# Patient Record
Sex: Female | Born: 1975 | Hispanic: Yes | Marital: Married | Smoking: Former smoker
Health system: Southern US, Community
[De-identification: ages and names within clinical notes are randomized; demographics above are authoritative.]

---

## 2017-04-08 ENCOUNTER — Emergency Department (HOSPITAL_COMMUNITY)
Admission: EM | Admit: 2017-04-08 | Discharge: 2017-04-08 | Disposition: A | Discharge status: Home / Self Care | Payer: Self-pay | Attending: Emergency Medicine | Admitting: Emergency Medicine

## 2017-04-08 ENCOUNTER — Emergency Department (HOSPITAL_COMMUNITY): Payer: Self-pay

## 2017-04-08 ENCOUNTER — Other Ambulatory Visit: Payer: Self-pay

## 2017-04-08 ENCOUNTER — Encounter (HOSPITAL_COMMUNITY): Payer: Self-pay

## 2017-04-08 DIAGNOSIS — O200 Threatened abortion: Secondary | ICD-10-CM

## 2017-04-08 DIAGNOSIS — O99331 Smoking (tobacco) complicating pregnancy, first trimester: Secondary | ICD-10-CM | POA: Insufficient documentation

## 2017-04-08 DIAGNOSIS — N3001 Acute cystitis with hematuria: Secondary | ICD-10-CM

## 2017-04-08 DIAGNOSIS — Z3A00 Weeks of gestation of pregnancy not specified: Secondary | ICD-10-CM | POA: Insufficient documentation

## 2017-04-08 DIAGNOSIS — F17210 Nicotine dependence, cigarettes, uncomplicated: Secondary | ICD-10-CM | POA: Insufficient documentation

## 2017-04-08 DIAGNOSIS — B349 Viral infection, unspecified: Secondary | ICD-10-CM

## 2017-04-08 DIAGNOSIS — O2341 Unspecified infection of urinary tract in pregnancy, first trimester: Secondary | ICD-10-CM | POA: Insufficient documentation

## 2017-04-08 LAB — CBC
HCT: 38 % (ref 36.0–46.0)
Hemoglobin: 12.2 g/dL (ref 12.0–15.0)
MCH: 29.3 pg (ref 26.0–34.0)
MCHC: 32.1 g/dL (ref 30.0–36.0)
MCV: 91.1 fL (ref 78.0–100.0)
PLATELETS: 283 10*3/uL (ref 150–400)
RBC: 4.17 MIL/uL (ref 3.87–5.11)
RDW: 14.2 % (ref 11.5–15.5)
WBC: 15.3 10*3/uL — AB (ref 4.0–10.5)

## 2017-04-08 LAB — COMPREHENSIVE METABOLIC PANEL
ALT: 31 U/L (ref 14–54)
ANION GAP: 9 (ref 5–15)
AST: 28 U/L (ref 15–41)
Albumin: 3.7 g/dL (ref 3.5–5.0)
Alkaline Phosphatase: 50 U/L (ref 38–126)
BUN: 8 mg/dL (ref 6–20)
CHLORIDE: 106 mmol/L (ref 101–111)
CO2: 23 mmol/L (ref 22–32)
CREATININE: 0.6 mg/dL (ref 0.44–1.00)
Calcium: 9.1 mg/dL (ref 8.9–10.3)
Glucose, Bld: 117 mg/dL — ABNORMAL HIGH (ref 65–99)
Potassium: 3.1 mmol/L — ABNORMAL LOW (ref 3.5–5.1)
Sodium: 138 mmol/L (ref 135–145)
Total Bilirubin: 0.4 mg/dL (ref 0.3–1.2)
Total Protein: 7.7 g/dL (ref 6.5–8.1)

## 2017-04-08 LAB — URINALYSIS, ROUTINE W REFLEX MICROSCOPIC
BILIRUBIN URINE: NEGATIVE
GLUCOSE, UA: NEGATIVE mg/dL
Ketones, ur: 5 mg/dL — AB
NITRITE: NEGATIVE
PH: 5 (ref 5.0–8.0)
Protein, ur: NEGATIVE mg/dL
SPECIFIC GRAVITY, URINE: 1.011 (ref 1.005–1.030)

## 2017-04-08 LAB — WET PREP, GENITAL
SPERM: NONE SEEN
TRICH WET PREP: NONE SEEN
YEAST WET PREP: NONE SEEN

## 2017-04-08 LAB — ABO/RH: ABO/RH(D): O POS

## 2017-04-08 LAB — HCG, QUANTITATIVE, PREGNANCY: hCG, Beta Chain, Quant, S: 5019 m[IU]/mL — ABNORMAL HIGH (ref <5)

## 2017-04-08 LAB — I-STAT BETA HCG BLOOD, ED (MC, WL, AP ONLY): I-stat hCG, quantitative: >2000 m[IU]/mL — ABNORMAL HIGH (ref <5)

## 2017-04-08 LAB — LIPASE, BLOOD: LIPASE: 19 U/L (ref 11–51)

## 2017-04-08 MED ORDER — CEPHALEXIN 500 MG PO CAPS: 1000.0000 mg | ORAL_CAPSULE | Freq: Two times a day (BID) | ORAL | 0 refills | Status: DC

## 2017-04-08 MED ORDER — ACETAMINOPHEN 500 MG PO TABS: 1000.0000 mg | ORAL_TABLET | Freq: Four times a day (QID) | ORAL | 0 refills | Status: DC | PRN

## 2017-04-08 MED ORDER — ACETAMINOPHEN 500 MG PO TABS
1000.0000 mg | ORAL_TABLET | Freq: Once | ORAL | Status: AC
  Administered 2017-04-08: 1000 mg via ORAL
  Filled 2017-04-08: qty 2

## 2017-04-08 MED ORDER — DEXTROSE 5 % IV SOLN
1.0000 g | Freq: Once | INTRAVENOUS | Status: AC
  Administered 2017-04-08: 1 g via INTRAVENOUS
  Filled 2017-04-08: qty 10

## 2017-04-08 NOTE — ED Notes (Signed)
EDP at bedside with video interpreter

## 2017-04-08 NOTE — Discharge Instructions (Signed)
1.  You need a recheck of your hCG (pregnancy hormone) level in 2 days. 2.  Return to the emergency department if you are having worsening pain or other serious symptoms. 3.  Take the antibiotic, Keflex, as prescribed.

## 2017-04-08 NOTE — ED Triage Notes (Signed)
Video Interpreter used for translation. Patient c/o bilateral lower abdominal pain and vaginal discharge that is blood-tinged at times since 02/28/17.

## 2017-04-08 NOTE — ED Provider Notes (Signed)
[facility]-EMERGENCY DEPT Provider Note   CSN: [id number] Arrival date & time: 04/08/17  1142     History   Chief Complaint Chief Complaint  Patient presents with  . Vaginal Discharge  . Abdominal Pain    HPI Joann Clay is a 41 y.o. female.  HPI Patient reports she has having lower abdominal pain and low back pain.  She indicates the suprapubic area and to both sides.  She reports that she had a abnormal vaginal bleeding November 18, then more bleeding November 22.  She denies having vomiting.  She denies suspicion of STD.  She denies diarrhea.  She does report she has felt generally achy, had a cough recently, subjective fever and generalized headache. History reviewed. No pertinent past medical history.  There are no active problems to display for this patient.   History reviewed. No pertinent surgical history.  OB History    No data available       Home Medications    Prior to Admission medications   Medication Sig Start Date End Date Taking? Authorizing Provider  acetaminophen (TYLENOL) 500 MG tablet Take 2 tablets (1,000 mg total) by mouth every 6 (six) hours as needed. 04/08/17   Arby BarrettePfeiffer, Britiany Silbernagel, MD  cephALEXin (KEFLEX) 500 MG capsule Take 2 capsules (1,000 mg total) by mouth 2 (two) times daily. 04/08/17   Arby BarrettePfeiffer, Aldan Camey, MD    Family History History reviewed. No pertinent family history.  Social History Social History   Tobacco Use  . Smoking status: Current Some Day Smoker    Types: Cigarettes  . Smokeless tobacco: Never Used  Substance Use Topics  . Alcohol use: Yes    Frequency: Never    Comment: occasionally  . Drug use: No     Allergies   Patient has no known allergies.   Review of Systems Review of Systems 10 Systems reviewed and are negative for acute change except as noted in the HPI.   Physical Exam Updated Vital Signs BP (!) 94/58 (BP Location: Left Arm)   Pulse 73   Temp 98.4 F (36.9 C)  (Oral)   Resp 18   Ht 5\' 2"  (1.575 m)   Wt 76.7 kg (169 lb)   LMP 02/28/2017 Comment: Pt does not speak english   SpO2 100%   BMI 30.91 kg/m   Physical Exam  Constitutional: She is oriented to person, place, and time. She appears well-developed and well-nourished. No distress.  Patient is clinically well in appearance.  She is alert and nontoxic.  No respiratory distress.  Moves about the room and ambulates without difficulty or objective appearance of pain.  HENT:  Head: Normocephalic and atraumatic.  Eyes: Conjunctivae and EOM are normal.  Neck: Neck supple.  Cardiovascular: Normal rate and regular rhythm.  No murmur heard. Pulmonary/Chest: Effort normal and breath sounds normal. No respiratory distress.  Abdominal: Soft. She exhibits no distension. There is tenderness.  Mild diffuse tenderness lower abdomen without guarding.  Genitourinary:  Genitourinary Comments: Normal external female genitalia.  Speculum exam: Pool of thin, red tinged discharge in the vaginal vault.  No active bleeding from the cervix.  Bimanual diffusely tender.  Uterus enlarged.  Musculoskeletal: Normal range of motion. She exhibits no edema, tenderness or deformity.  Neurological: She is alert and oriented to person, place, and time. No cranial nerve deficit. She exhibits normal muscle tone. Coordination normal.  Skin: Skin is warm and dry.  Psychiatric: She has a normal mood and affect.  Nursing note and  vitals reviewed.    ED Treatments / Results  Labs (all labs ordered are listed, but only abnormal results are displayed) Labs Reviewed  WET PREP, GENITAL - Abnormal; Notable for the following components:      Result Value   Clue Cells Wet Prep HPF POC PRESENT (*)    WBC, Wet Prep HPF POC MANY (*)    All other components within normal limits  CBC - Abnormal; Notable for the following components:   WBC 15.3 (*)    All other components within normal limits  URINALYSIS, ROUTINE W REFLEX MICROSCOPIC -  Abnormal; Notable for the following components:   Hgb urine dipstick LARGE (*)    Ketones, ur 5 (*)    Leukocytes, UA SMALL (*)    Bacteria, UA RARE (*)    Squamous Epithelial / LPF 0-5 (*)    All other components within normal limits  COMPREHENSIVE METABOLIC PANEL - Abnormal; Notable for the following components:   Potassium 3.1 (*)    Glucose, Bld 117 (*)    All other components within normal limits  HCG, QUANTITATIVE, PREGNANCY - Abnormal; Notable for the following components:   hCG, Beta Chain, Quant, S 5,019 (*)    All other components within normal limits  I-STAT BETA HCG BLOOD, ED ([facility], [facility], [facility] ONLY) - Abnormal; Notable for the following components:   I-stat hCG, quantitative >2,000.0 (*)    All other components within normal limits  LIPASE, BLOOD  RPR  HIV ANTIBODY (ROUTINE TESTING)  ABO/RH  GC/CHLAMYDIA PROBE AMP ([place]) NOT AT [facility]    EKG  EKG Interpretation None       Radiology Dg Chest 2 View  Result Date: 04/08/2017 CLINICAL DATA:  Dry cough for 1 week, smoker EXAM: CHEST  2 VIEW COMPARISON:  None FINDINGS: Normal heart size, mediastinal contours, and pulmonary vascularity. Minimal peribronchial thickening. No pulmonary infiltrate, pleural effusion, or pneumothorax. Bones unremarkable. IMPRESSION: Minimal bronchitic changes without infiltrate. Electronically Signed   By: Ulyses SouthwardMark  Boles M.D.   On: 04/08/2017 18:55   [place] Ob Comp < 14 Wks  Result Date: 04/08/2017 CLINICAL DATA:  Adnexal pain with discharge EXAM: OBSTETRIC <14 WK US AND TRANSVAGINAL OB US TECHNIQUE: Both transabdominal and transvaginal ultrasound examinations were performed for complete evaluation of the gestation as well as the maternal uterus, adnexal regions, and pelvic cul-de-sac. Transvaginal technique was performed to assess early pregnancy. COMPARISON:  None. FINDINGS: Intrauterine gestational sac: Not visible Yolk sac:  Not visible Embryo:  Not visible Maternal uterus/adnexae: Ovaries are  within normal limits. The left ovary measures 5 x 2.1 x 2.1 cm. The right ovary measures 3.7 x 2 x 2.3 cm. Trace fluid in the cul-de-sac. Small nabothian cysts. IMPRESSION: 1. No intrauterine pregnancy is visualized. Findings are consistent with pregnancy of unknown location, differential of which includes recent failed pregnancy, occult ectopic, any intrauterine pregnancy too early to visualize (less likely given HCG > 5,000). Recommend trending of HCG with follow-up pelvic ultrasound as indicated 2. Trace free fluid in the pelvis Electronically Signed   By: Jasmine PangKim  Fujinaga M.D.   On: 04/08/2017 19:56   [place] Ob Transvaginal  Result Date: 04/08/2017 CLINICAL DATA:  Adnexal pain with discharge EXAM: OBSTETRIC <14 WK US AND TRANSVAGINAL OB US TECHNIQUE: Both transabdominal and transvaginal ultrasound examinations were performed for complete evaluation of the gestation as well as the maternal uterus, adnexal regions, and pelvic cul-de-sac. Transvaginal technique was performed to assess early pregnancy. COMPARISON:  None. FINDINGS: Intrauterine gestational sac:  Not visible Yolk sac:  Not visible Embryo:  Not visible Maternal uterus/adnexae: Ovaries are within normal limits. The left ovary measures 5 x 2.1 x 2.1 cm. The right ovary measures 3.7 x 2 x 2.3 cm. Trace fluid in the cul-de-sac. Small nabothian cysts. IMPRESSION: 1. No intrauterine pregnancy is visualized. Findings are consistent with pregnancy of unknown location, differential of which includes recent failed pregnancy, occult ectopic, any intrauterine pregnancy too early to visualize (less likely given HCG > 5,000). Recommend trending of HCG with follow-up pelvic ultrasound as indicated 2. Trace free fluid in the pelvis Electronically Signed   By: Jasmine Pang M.D.   On: 04/08/2017 19:56    Procedures Procedures (including critical care time)  Medications Ordered in ED Medications  cefTRIAXone (ROCEPHIN) 1 g in dextrose 5 % 50 mL IVPB (not  administered)     Initial Impression / Assessment and Plan / ED Course  I have reviewed the triage vital signs and the nursing notes.  Pertinent labs & imaging results that were available during my care of the patient were reviewed by me and considered in my medical decision making (see chart for details).     Final Clinical Impressions(s) / ED Diagnoses   Final diagnoses:  Threatened abortion in first trimester  Acute cystitis with hematuria  Viral syndrome  Patient presents as outlined above with a chief complaint of lower abdominal and back pain and abnormal vaginal bleeding.  Quantitative hCG is at 5000.  Ultrasound does not show IUP.  Patient has been having bleeding.  At this time, have highest suspicion for spontaneous abortion.  Patient is however counseled on the need for repeat quantitative hCG in 2 days to determine if this is elevating or dropping.  She is educated on possible ectopic pregnancy.  Urinalysis does show 6-30 white cells with a good specimen.  Patient is treated with Rocephin IV and will be continued on Keflex for UTI.  She describes URI symptoms such as cough and headache and myalgia.  Physical exam is normal with clear lungs and no pneumonia on chest x-ray.  At this time findings are most suggestive of viral symptoms.  Clinically the patient is well in appearance without any objective respiratory distress.  Patient will follow up in 2 days for repeat quantitative hCG and advised to return for any other concerning symptoms.  ED Discharge Orders        Ordered    cephALEXin (KEFLEX) 500 MG capsule  2 times daily     04/08/17 2128    acetaminophen (TYLENOL) 500 MG tablet  Every 6 hours PRN     04/08/17 2128       Arby Barrette, MD 04/08/17 2137

## 2017-04-08 NOTE — ED Notes (Signed)
US at bedside

## 2017-04-08 NOTE — ED Notes (Signed)
Pt did not come when called from lobby for vitals to be updated x1.  

## 2017-04-09 LAB — SYPHILIS: RPR W/REFLEX TO RPR TITER AND TREPONEMAL ANTIBODIES, TRADITIONAL SCREENING AND DIAGNOSIS ALGORITHM: RPR Ser Ql: NONREACTIVE

## 2017-04-09 LAB — GC/CHLAMYDIA PROBE AMP (~~LOC~~) NOT AT ARMC
Chlamydia: NEGATIVE
NEISSERIA GONORRHEA: NEGATIVE

## 2017-04-09 LAB — HIV ANTIBODY (ROUTINE TESTING W REFLEX): HIV Screen 4th Generation wRfx: NONREACTIVE

## 2017-04-10 LAB — URINE CULTURE: Culture: <10000 — AB

## 2017-07-05 ENCOUNTER — Emergency Department (HOSPITAL_COMMUNITY)
Admission: EM | Admit: 2017-07-05 | Discharge: 2017-07-05 | Disposition: A | Discharge status: Home / Self Care | Payer: Self-pay | Attending: Emergency Medicine | Admitting: Emergency Medicine

## 2017-07-05 ENCOUNTER — Encounter (HOSPITAL_COMMUNITY): Payer: Self-pay | Admitting: Emergency Medicine

## 2017-07-05 DIAGNOSIS — W25XXXA Contact with sharp glass, initial encounter: Secondary | ICD-10-CM | POA: Insufficient documentation

## 2017-07-05 DIAGNOSIS — F17210 Nicotine dependence, cigarettes, uncomplicated: Secondary | ICD-10-CM | POA: Insufficient documentation

## 2017-07-05 DIAGNOSIS — S71111A Laceration without foreign body, right thigh, initial encounter: Secondary | ICD-10-CM | POA: Insufficient documentation

## 2017-07-05 DIAGNOSIS — Y929 Unspecified place or not applicable: Secondary | ICD-10-CM | POA: Insufficient documentation

## 2017-07-05 DIAGNOSIS — S81811A Laceration without foreign body, right lower leg, initial encounter: Secondary | ICD-10-CM

## 2017-07-05 DIAGNOSIS — Y939 Activity, unspecified: Secondary | ICD-10-CM | POA: Insufficient documentation

## 2017-07-05 DIAGNOSIS — Z23 Encounter for immunization: Secondary | ICD-10-CM | POA: Insufficient documentation

## 2017-07-05 DIAGNOSIS — Y999 Unspecified external cause status: Secondary | ICD-10-CM | POA: Insufficient documentation

## 2017-07-05 MED ORDER — CEPHALEXIN 500 MG PO CAPS: 500.0000 mg | ORAL_CAPSULE | Freq: Three times a day (TID) | ORAL | 0 refills | Status: DC

## 2017-07-05 MED ORDER — TETANUS-DIPHTH-ACELL PERTUSSIS 5-2.5-18.5 LF-MCG/0.5 IM SUSP
0.5000 mL | Freq: Once | INTRAMUSCULAR | Status: AC
  Administered 2017-07-05: 0.5 mL via INTRAMUSCULAR
  Filled 2017-07-05: qty 0.5

## 2017-07-05 MED ORDER — LIDOCAINE-EPINEPHRINE (PF) 2 %-1:200000 IJ SOLN
20.0000 mL | Freq: Once | INTRAMUSCULAR | Status: AC
  Administered 2017-07-05: 20 mL
  Filled 2017-07-05: qty 20

## 2017-07-05 NOTE — Discharge Instructions (Signed)
It was my pleasure taking care of you today!  ° °Keep wound clean with mild soap and water. Keep area covered with a topical antibiotic ointment and bandage, keep bandage dry, and do not submerge in water for 24 hours. Ice and elevate for additional pain relief and swelling. Alternate between ibuprofen and Tylenol for additional pain relief. Follow up with your primary care doctor, [facility] or ER in approximately 10 days for wound recheck and suture removal. Monitor area for signs of infection to include, but not limited to: increasing pain, spreading redness, drainage/pus, worsening swelling, or fevers. Return to emergency department for emergent changing or worsening symptoms. ° ° °WOUND CARE °Keep area clean and dry for 24 hours. Do not remove bandage, if applied. °After 24 hours,you should change it at least once a day. Also, change the dressing if it becomes wet or dirty, or as directed by your caregiver.  °Wash the wound with soap and water 2 times a day. Rinse the wound off with water to remove all soap. Pat the wound dry with a clean towel.  °You may shower as usual after the first 24 hours. Do not soak the wound in water until the sutures are removed.  °Return if you experience any of the following signs of infection: Swelling, redness, pus drainage, streaking, fever >101.0 F °Return if you experience excessive bleeding that does not stop after 15-20 minutes of constant, firm pressure. ° ° °

## 2017-07-05 NOTE — ED Provider Notes (Signed)
[facility]-EMERGENCY DEPT Provider Note   CSN: [id number] Arrival date & time: 07/05/17  1205     History   Chief Complaint Chief Complaint  Patient presents with  . Puncture Wound    HPI Margarita MailGlenny Rodriguez-Prado is a 42 y.o. female.  The history is provided by the patient and medical records. A language interpreter was used ([employer] video spanish interpreter).   Margarita MailGlenny Rodriguez-Prado is a 42 y.o. female with no known PMH who presents to the Emergency Department complaining of laceration to right leg which occurred last night.  Patient states that she was sitting on a glass table when it broke and a piece of the glass cut her leg.  Pain with palpation.  No medications taken prior to arrival for symptoms.  No numbness, tingling or weakness.  Unsure of last tetanus vaccine.   History reviewed. No pertinent past medical history.  There are no active problems to display for this patient.   History reviewed. No pertinent surgical history.   OB History   None      Home Medications    Prior to Admission medications   Medication Sig Start Date End Date Taking? Authorizing Provider  acetaminophen (TYLENOL) 500 MG tablet Take 2 tablets (1,000 mg total) by mouth every 6 (six) hours as needed. 04/08/17   Arby BarrettePfeiffer, Marcy, MD  cephALEXin (KEFLEX) 500 MG capsule Take 1 capsule (500 mg total) by mouth 3 (three) times daily. 07/05/17   Ward, Chase PicketJaime Pilcher, PA-C    Family History No family history on file.  Social History Social History   Tobacco Use  . Smoking status: Current Some Day Smoker    Types: Cigarettes  . Smokeless tobacco: Never Used  Substance Use Topics  . Alcohol use: Yes    Frequency: Never    Comment: occasionally  . Drug use: No     Allergies   Patient has no known allergies.   Review of Systems Review of Systems  Constitutional: Negative for chills and fever.  Musculoskeletal: Positive for myalgias.  Skin: Positive for wound.    Neurological: Negative for weakness and numbness.     Physical Exam Updated Vital Signs BP 120/77 (BP Location: Right Arm)   Pulse 81   Temp 98.5 F (36.9 C)   Resp 16   LMP 05/24/2017   SpO2 95%   Physical Exam  Constitutional: She appears well-developed and well-nourished. No distress.  HENT:  Head: Normocephalic and atraumatic.  Neck: Neck supple.  Cardiovascular: Normal rate, regular rhythm and normal heart sounds.  No murmur heard. Pulmonary/Chest: Effort normal and breath sounds normal. No respiratory distress. She has no wheezes. She has no rales.  Musculoskeletal: Normal range of motion.  Neurological: She is alert.  Bilateral lower extremities neurovascularly intact.  Skin: Skin is warm and dry.  6 cm laceration to posterior upper right leg. No surrounding erythema.  Nursing note and vitals reviewed.    ED Treatments / Results  Labs (all labs ordered are listed, but only abnormal results are displayed) Labs Reviewed - No data to display  EKG None  Radiology No results found.  Procedures .Marland Kitchen.Laceration Repair Date/Time: 07/05/2017 2:47 PM Performed by: Ward, Chase PicketJaime Pilcher, PA-C Authorized by: Ward, Chase PicketJaime Pilcher, PA-C   Consent:    Consent obtained:  Verbal   Consent given by:  Patient   Risks discussed:  Pain, infection, poor cosmetic result and poor wound healing Anesthesia (see MAR for exact dosages):    Anesthesia method:  Local infiltration  Local anesthetic:  Lidocaine 2% WITH epi Laceration details:    Location:  Leg   Leg location:  R upper leg   Length (cm):  6 Repair type:    Repair type:  Simple Pre-procedure details:    Preparation:  Patient was prepped and draped in usual sterile fashion Exploration:    Hemostasis achieved with:  Direct pressure   Wound exploration: wound explored through full range of motion and entire depth of wound probed and visualized     Wound extent: no foreign bodies/material noted, no nerve damage noted,  no tendon damage noted and no underlying fracture noted   Treatment:    Area cleansed with:  Saline and Betadine   Amount of cleaning:  Extensive   Irrigation solution:  Sterile saline   Irrigation volume:  1 liter   Irrigation method:  Syringe Mucous membrane repair:    Wound mucous membrane closure material used: Prolene. Skin repair:    Repair method:  Sutures   Suture size:  4-0   Suture material:  Prolene   Suture technique:  Simple interrupted   Number of sutures:  7 Approximation:    Approximation:  Close Post-procedure details:    Dressing:  Open (no dressing)   Patient tolerance of procedure:  Tolerated well, no immediate complications   (including critical care time)  Medications Ordered in ED Medications  Tdap (BOOSTRIX) injection 0.5 mL (has no administration in time range)  lidocaine-EPINEPHrine (XYLOCAINE W/EPI) 2 %-1:200000 (PF) injection 20 mL (has no administration in time range)     Initial Impression / Assessment and Plan / ED Course  I have reviewed the triage vital signs and the nursing notes.  Pertinent labs & imaging results that were available during my care of the patient were reviewed by me and considered in my medical decision making (see chart for details).     Alira Fretwell is a 42 y.o. female who presents to ED for laceration of right thigh.  Injury did occur last night, approx 14 hours ago. Discussed delayed wound closure with attending, Dr. Criss Alvine, who recommends extensive irrigation and closing the wounds as well as placing on prophylactic ABX. Wound thoroughly cleaned in ED today. Wound explored and bottom of wound seen in a bloodless field. No foreign body.  Laceration repaired as dictated above. Patient counseled on home wound care. Follow up with PCP/urgent care or return to ER for suture removal in 10 days. Patient was urged to return to the Emergency Department for worsening pain, swelling, expanding erythema especially if it  streaks away from the affected area, fever, or for any additional concerns. Patient verbalized understanding. All questions answered.   Final Clinical Impressions(s) / ED Diagnoses   Final diagnoses:  Laceration of right lower extremity, initial encounter    ED Discharge Orders        Ordered    cephALEXin (KEFLEX) 500 MG capsule  3 times daily     07/05/17 1444       Ward, Chase Picket, PA-C 07/05/17 1450    Pricilla Loveless, MD 07/06/17 [id number]

## 2017-07-05 NOTE — ED Triage Notes (Signed)
Per interpreter, patient sat on a glass table last night and it broke. Puncture wound approximately two inches to right posterior thigh. Bleeding controlled. Patient ambulatory. Last tdap unknown.

## 2017-08-11 ENCOUNTER — Encounter (HOSPITAL_COMMUNITY): Payer: Self-pay

## 2017-08-11 ENCOUNTER — Other Ambulatory Visit (HOSPITAL_COMMUNITY): Payer: Self-pay | Admitting: *Deleted

## 2017-08-11 DIAGNOSIS — N6310 Unspecified lump in the right breast, unspecified quadrant: Secondary | ICD-10-CM

## 2017-08-31 ENCOUNTER — Encounter (HOSPITAL_COMMUNITY): Payer: Self-pay

## 2017-08-31 ENCOUNTER — Ambulatory Visit
Admission: RE | Admit: 2017-08-31 | Discharge: 2017-08-31 | Disposition: A | Discharge status: Home / Self Care | Payer: No Typology Code available for payment source | Source: Ambulatory Visit | Attending: Obstetrics and Gynecology | Admitting: Obstetrics and Gynecology

## 2017-08-31 ENCOUNTER — Other Ambulatory Visit (HOSPITAL_COMMUNITY): Payer: Self-pay | Admitting: Obstetrics and Gynecology

## 2017-08-31 ENCOUNTER — Ambulatory Visit (HOSPITAL_COMMUNITY)
Admission: RE | Admit: 2017-08-31 | Discharge: 2017-08-31 | Disposition: A | Discharge status: Home / Self Care | Payer: Self-pay | Source: Ambulatory Visit | Attending: Obstetrics and Gynecology | Admitting: Obstetrics and Gynecology

## 2017-08-31 VITALS — BP 110/68 | Ht 62.0 in | Wt 169.0 lb

## 2017-08-31 DIAGNOSIS — N6310 Unspecified lump in the right breast, unspecified quadrant: Secondary | ICD-10-CM

## 2017-08-31 DIAGNOSIS — R87612 Low grade squamous intraepithelial lesion on cytologic smear of cervix (LGSIL): Secondary | ICD-10-CM

## 2017-08-31 DIAGNOSIS — Z1239 Encounter for other screening for malignant neoplasm of breast: Secondary | ICD-10-CM

## 2017-08-31 DIAGNOSIS — N644 Mastodynia: Secondary | ICD-10-CM

## 2017-08-31 NOTE — Addendum Note (Signed)
Encounter addended by: Amahd Morino P, RN on: 08/31/2017 12:58 PM  Actions taken: Sign clinical note

## 2017-08-31 NOTE — Patient Instructions (Addendum)
Explained breast self awareness with Libi Clay. Patient did not need a Pap smear today due to last Pap smear was 07/27/2017. Explained the colposcopy the recommended follow-up for her abnormal Pap smear. [facility] the [facility] for a Colposcopy to follow-up for abnormal Pap smear. Appointment scheduled for Monday, September 20, 2017 at 1455. Referred patient to the [facility] of [facility] for a diagnostic mammogram and possible right breast ultrasound. Appointment scheduled for Tuesday, Aug 31, 2017 at 1120. Patient aware of appointments and will be there. Discussed smoking cessation with patient. Referred to the [facility] Quitline and gave resources to the free smoking cessation classes at [facility]. Joann Clay verbalized understanding.  Brannock, Kathaleen Maser, RN 9:59 AM

## 2017-08-31 NOTE — Progress Notes (Addendum)
Patient referred to BCCCP by the [facility] Department due to having an abnormal Pap smear on 07/27/2017 that a colposcopy is recommended for follow-up.   Patient complained of a right breast lump since December 2018 that is painful. Patient states the pain comes and goes. Patient rates the pain at a 7 out of 10.  Pap Smear: Pap smear not completed today. Last Pap smear was 07/27/2017 at the [facility] and [facility]. Referred patient the [facility] at [facility] for a Colposcopy to follow-up for abnormal Pap smear. Appointment scheduled for Monday, September 20, 2017 at 1455. Per patient her most recent Pap smear is the only abnormal Pap smear she has had. Last Pap smear result is in Epic.  Physical exam: Breasts Breasts symmetrical. No skin abnormalities bilateral breasts. No nipple retraction bilateral breasts. No nipple discharge bilateral breasts. No lymphadenopathy. No lumps palpated left breast. Palpated a bb sized lump within the right breast under nipple. Complaints of tenderness when palpated right breast lump and mild tenderness when palpated left outer breast on exam. Referred patient to the [facility] of [facility] for a diagnostic mammogram and possible right breast ultrasound. Appointment scheduled for Tuesday, Aug 31, 2017 at 1120.        Pelvic/Bimanual No Pap smear completed today since last Pap smear was 07/27/2017. Pap smear not indicated per BCCCP guidelines.   Smoking History: Patient is a current smoker. Discussed smoking cessation with patient. Referred to the [facility] Quitline and gave resources to the free smoking cessation classes at [facility]  Patient Navigation: Patient education provided. Access to services provided for patient through [facility] program. Spanish interpreter provided.   Breast and Cervical Cancer Risk Assessment: Patient has no family history of breast cancer, known genetic mutations, or radiation treatment  to the chest before age 35. Patient has no history of cervical dysplasia, immunocompromised, or DES exposure in-utero. Patient has a 5-year risk of breast cancer at 0.4% and a Lifetime risk of 5.7%.  Used Spanish interpreter [employer] from [place].

## 2017-09-17 ENCOUNTER — Encounter (HOSPITAL_COMMUNITY): Payer: Self-pay | Admitting: *Deleted

## 2017-09-20 ENCOUNTER — Ambulatory Visit (INDEPENDENT_AMBULATORY_CARE_PROVIDER_SITE_OTHER): Payer: Self-pay | Admitting: Obstetrics & Gynecology

## 2017-09-20 ENCOUNTER — Other Ambulatory Visit (HOSPITAL_COMMUNITY)
Admission: RE | Admit: 2017-09-20 | Discharge: 2017-09-20 | Disposition: A | Discharge status: Home / Self Care | Payer: No Typology Code available for payment source | Source: Ambulatory Visit | Attending: Obstetrics & Gynecology | Admitting: Obstetrics & Gynecology

## 2017-09-20 ENCOUNTER — Encounter: Payer: Self-pay | Admitting: Obstetrics & Gynecology

## 2017-09-20 VITALS — BP 122/73 | HR 81 | Wt 170.4 lb

## 2017-09-20 DIAGNOSIS — R87612 Low grade squamous intraepithelial lesion on cytologic smear of cervix (LGSIL): Secondary | ICD-10-CM

## 2017-09-20 LAB — POCT PREGNANCY, URINE: Preg Test, Ur: NEGATIVE

## 2017-09-20 NOTE — Progress Notes (Signed)
Patient given informed consent, signed copy in the chart, time out was performed.  Placed in lithotomy position. Cervix viewed with speculum and colposcope after application of acetic acid.  07/28/2017 [facility]  Colposcopy adequate?  yes Acetowhite lesions? yes Punctation? no Mosaicism?  no Abnormal vasculature?  no Biopsies? Yes at 4-6 o'clock ECC? yes   Patient was given post procedure instructions.  She will return in 2 weeks for results. Interpreter #[id number]  Concepcion Elkarolyn L. Harraway-Smith, M.D., [place] CoreFACOG

## 2017-09-20 NOTE — Progress Notes (Signed)
Video Interpreter # [id number]

## 2017-09-20 NOTE — Patient Instructions (Signed)
Colposcopia (Colposcopy) La colposcopia es un procedimiento para examinar la parte inferior del tero (cuello del tero), la vagina, la zona que rodea la abertura vaginal (vulva) para identificar anormalidades o signos de enfermedad. Para realizar este procedimiento se utiliza un microscopio con luz o una lupa (colposcopio). Si se encuentran clulas inusuales durante el procedimiento, el mdico puede extraer una muestra de tejido para examinarla (biopsia). Se puede realizar una colposcopia si:  Tiene un Papanicolaou anormal. Un Papanicolaou es una prueba de deteccin que se utiliza para detectar signos de cncer o infeccin en la vagina, el cuello del tero y el tero.  Tiene un Papanicolaou con resultado positivo de VPH de alto riesgo (virus del papiloma humano).  Si tiene una llaga o una lesin en el cuello del tero.  Tiene verrugas genitales en la vulva, la vagina o el cuello del tero.  Tom ciertos medicamentos durante el embarazo, como dietiletilbestrol (DES).  Siente dolor durante las relaciones sexuales.  Tiene hemorragias vaginales, especialmente despus de mantener relaciones sexuales.  Tienen que extraerle un plipo del cuello del tero.  Tienen que encontrarle un hilo perdido del dispositivo intrauterino (DIU). INFORME A SU MDICO:  Cualquier alergia que tenga, incluidas las alergias a medicamentos recetados, al ltex o al yodo.  Todos los medicamentos que utiliza, incluidos vitaminas, hierbas, gotas oftlmicas, cremas y medicamentos de venta libre. Lleve una lista de todos sus medicamentos a la consulta.  Cualquier problema previo que usted o los miembros de su familia hayan tenido con anestsicos.  Enfermedades de la sangre que tenga.  Cirugas previas.  Cualquier afeccin mdica que tenga, como enfermedad plvica inflamatoria (EPI) o trastorno endometrial.  Algn antecedente de desmayos frecuentes.  Su ciclo menstrual y el mtodo de control de la natalidad  (anticonceptivo) que utiliza.  Su historia clnica, incluido cualquier tratamiento previo en el cuello del tero.  Si est embarazada o podra estarlo. RIESGOS Y COMPLICACIONES En general, se trata de un procedimiento seguro. Sin embargo, pueden ocurrir complicaciones, por ejemplo:  Dolor.  Infeccin, que puede incluir fiebre, secrecin con mal olor o dolor plvico.  Hemorragia o secrecin.  Diagnstico errneo.  Desmayos y reacciones vasovagales, pero esto es poco frecuente.  Reacciones alrgicas a los medicamentos.  Daos a otras estructuras u otros rganos. ANTES DEL PROCEDIMIENTO  Si tiene el perodo menstrual o lo tendr en el momento del procedimiento, dgaselo al mdico. En general, la colposcopia no se realiza durante la menstruacin.  Contine con sus prcticas anticonceptivas antes y despus del procedimiento.  Durante las 24horas previas a la colposcopia: ? No se haga duchas vaginales. ? No use tampones. ? No se aplique medicamentos, cremas o supositorios en la vagina. ? No tenga relaciones sexuales.  Consulte al mdico si debe hacer o no lo siguiente: ? Cambiar o suspender los medicamentos que toma habitualmente. Esto es muy importante si toma medicamentos para la diabetes o anticoagulantes. ? Tomar medicamentos como aspirina e ibuprofeno. Estos medicamentos pueden tener un efecto anticoagulante en la sangre. No tome estos medicamentos antes del procedimiento si el mdico le indica que no lo haga. Es probable que el mdico le diga que evite tomar aspirina o medicamentos que contienen aspirina durante 7das antes del procedimiento.  Siga las indicaciones del mdico respecto de las restricciones para las comidas o las bebidas. Probablemente deba seguir una dieta normal el da del procedimiento y no omitir ninguna comida.  Pueden hacerle un examen o estudios. Se realizar una prueba de embarazo el da del procedimiento.  Pueden   extraerle una muestra de [place] o  pedirle [place]una muestra de [place] Runorina.  Haga planes para que una persona la lleve a su casa despus del hospital o la clnica.  Si se ir a su casa inmediatamente despus del procedimiento, planifique que alguien se quede con usted durante 24horas. PROCEDIMIENTO  Estar acostada boca arriba, con los pies eLauris Poagn los soportes (estribos).  Se introducir en la vagina un instrumento tibio y lubricado (espculo). El Reliant Energyespculo se usar para separar las paredes de la vagina de modo que el mdico pueda ver el cuello del tero y el interior de la vagina.  Se utilizar un hisopo para Scientific laboratory techniciancolocar una pequea cantidad de solucin lquida en las zonas que se examinarn. Esta solucin facilita la observacin de clulas anormales. Puede sentir un leve ardor en este momento.  Se usar el colposcopio para observar el cuello del tero con una luz blanca brillante. Se sostendr el colposcopio cerca de la vulva, y Electronics engineereste amplificar la vulva, la vagina y el cuello del tero para facilitar la observacin.  El mdico puede decidir si realiza una biopsia. En este caso: ? Pueden administrarle un medicamento para adormecer la zona (anestesia local). ? Se emplearn instrumentos quirrgicos para succionar mucosidad y clulas a travs de la vagina. ? Puede sentir un dolor leve cuando le extraen la [place]muestra de tejido. ? Puede haber hemorragia. Se puede usar una solucin para Comptrollerdetener la hemorragia. ? Si se necesita Lauris Poaguna muestra de tejido del interior del cuello del tero, se puede realizar otro procedimiento, llamado curetaje endocervical (CEC). Durante este procedimiento, se usar un instrumento curvo (cureta) para raspar clulas del cuello del tero o de la parte superior del tero (endocervix).  El Engineer, civil (consulting)mdico registrar la ubicacin de cualquier anormalidad. Este procedimiento puede variar segn el mdico y el hospital. DESPUS DEL PROCEDIMIENTO  Estar recostada y har reposo durante unos minutos. Tal vez le ofrezcan jugo o galletas.  Le  controlarn la presin arterial, la frecuencia cardaca, la frecuencia respiratoria y Air cabin crewel nivel de oxgeno en la sangre hasta que haya desaparecido el efecto de los medicamentos administrados.  Quizs Huntsman Corporationdeba usar medias de compresin. Estas medias ayudan a Transport plannerevitar la formacin de cogulos sanguneos y a Building services engineerreducir la hinchazn de las piernas.  Puede tener calambres en el abdomen. Esto debe desaparecer despus de algunos minutos. Esta informacin no tiene Theme park managercomo fin reemplazar el consejo del mdico. Asegrese de hacerle al mdico cualquier pregunta que tenga. Document Released: 03/30/2005 Document Revised: 11/30/2012 Document Reviewed: 11/04/2015 Elsevier Interactive Patient Education  2018 [employer] Colposcopa - Cuidados posteriores (Colposcopy, Care After) Siga estas instrucciones durante las prximas semanas. Estas indicaciones le proporcionan informacin general acerca de cmo deber cuidarse despus del procedimiento. El mdico tambin podr darle instrucciones ms especficas. El tratamiento se ha planificado de acuerdo a las prcticas mdicas actuales, pero a veces se producen problemas. Comunquese con el mdico si tiene algn problema o tiene dudas despus del procedimiento. QU ESPERAR DESPUS DEL PROCEDIMIENTO  Despus del procedimiento, es tpico tener las siguientes sensaciones:  Clicos. Generalmente se calman en algunos minutos.  Dolor. Puede durar [facility] [place] dos das.  Aturdimiento. Si esto le ocurre, recustese durante algunos minutos. Podr tener un sangrado leve o una secrecin oscura que debe detenerse en [place]gunos das. Durante este tiempo deber usar un apsito sanitario. INSTRUCCIONES PARA EL CUIDADO EN EL HOGAR  Evite las [address] Nerelaciones sexuales, las duchas vaginales y el uso de tampones durante 3 [place]das, o segn lo que le indique su mdico.  Tome slo medicamentos  de venta libre o recetados, segn las indicaciones del mdico. No tome aspirina, ya que puede causar hemorragias.  Si  utiliza pldoras anticonceptivas, contine tomndolas.  No todos los resultados estarn disponibles durante su visita. En este caso, tenga otra entrevista con su mdico para conocerlos. No suponga que es normal si no tiene noticias de su mdico o del establecimiento de salud. Es Copy seguimiento de todos los [place].  Siga los consejos de su mdico con respecto a los [place], [place], visitas y Papanicolau de control. SOLICITE ATENCIN MDICA SI:  Aparece una erupcin cutnea.  Tiene problemas con los medicamentos. SOLICITE ATENCIN MDICA DE INMEDIATO SI:  Tiene una hemorragia abundante o elimina cogulos.  Tiene fiebre.  Tiene flujo vaginal anormal.  Tiene clicos que no se alivian luego de tomar analgsicos.  Se siente mareada, tiene vahdos o se desmaya.  Siente Physiological scientist. Esta informacin no tiene Theme park manager el consejo del mdico. Asegrese de hacerle al mdico cualquier pregunta que tenga. Document Released: 01/18/2013 Elsevier Interactive Patient Education  2017 [employer].

## 2017-10-11 ENCOUNTER — Encounter: Payer: Self-pay | Admitting: Obstetrics & Gynecology

## 2017-10-11 ENCOUNTER — Ambulatory Visit (INDEPENDENT_AMBULATORY_CARE_PROVIDER_SITE_OTHER): Payer: Self-pay | Admitting: Obstetrics & Gynecology

## 2017-10-11 VITALS — BP 120/71 | HR 87 | Resp 16 | Wt 170.4 lb

## 2017-10-11 DIAGNOSIS — Z3009 Encounter for other general counseling and advice on contraception: Secondary | ICD-10-CM

## 2017-10-11 MED ORDER — LEVONORGESTREL-ETHINYL ESTRAD 0.15-30 MG-MCG PO TABS: 1.0000 | ORAL_TABLET | Freq: Every day | ORAL | 11 refills | Status: DC

## 2017-10-11 NOTE — Progress Notes (Signed)
History:  42 y.o. [id number] here today for f/u of colpo results. Pt is also interested in a refill of her birth control pills. She has been on [facility]. She ran out 2 weeks ago and has not been sexually active due to her cervical bx and the fact that she did not have a refill on her contraception. She has no new complaints. She has been on OCPs without complications.    The following portions of the patient's history were reviewed and updated as appropriate: allergies, current medications, past family history, past medical history, past social history, past surgical history and problem list.  Review of Systems:  Pertinent items are noted in HPI.    Objective:  Physical Exam Blood pressure 120/71, pulse 87, resp. rate 16, weight 170 lb 6.4 oz (77.3 kg), last menstrual period 09/15/2017.  CONSTITUTIONAL: Well-developed, well-nourished female in no acute distress.  HENT:  Normocephalic, atraumatic EYES: Conjunctivae and EOM are normal. No scleral icterus.  NECK: Normal range of motion SKIN: Skin is warm and dry. No rash noted. Not diaphoretic.No pallor. NEUROLGIC: Alert and oriented to person, place, and time. Normal coordination.   Labs and Imaging 09/20/2017 1. Cervix, biopsy, 4-6 o'clock - LOW GRADE SQUAMOUS INTRAEPITHELIAL LESION (CIN1, MILD DYSPLASIA) - BENIGN ENDOCERVICAL GLANDULAR MUCOSA 2. Endocervix, curettage - BENIGN ENDOCERVICAL GLANDULAR EPITHELIUM - NO SQUAMOUS EPITHELIUM IDENTIFIED - NO MALIGNANCY IDENTIFIED Assessment & Plan:  Cervical dysplasia-  Reviewed cervical dysplasia and the tx options  Will observe for now  F/u in 1 year  Contraception counseling  Refilled Portia generic 1.5/30 OCPs  F/u in 1 year  Pt encouraged to use condoms for the 1st 2 weeks or restarting the pill  Total face-to-face time with patient was 18 min.  Greater than 50% was spent in counseling and coordination of care with the patient.  A spanish interpreter was used for the entire  exam  Zanaria Morell L. Harraway-Smith, M.D., Evern CoreFACOG

## 2017-10-11 NOTE — Patient Instructions (Signed)
Uso de los Electronics engineer (Oral Contraception Use) Los anticonceptivos orales (ACO) son medicamentos que se utilizan para Therapist, occupational. Su funcin es [employer] ovarios liberen vulos. Las hormonas de los ACO tambin hacen que el moco cervical se haga ms espeso, lo que evita que el esperma ingrese al tero. Tambin hacen que la membrana que recubre internamente al tero se vuelva ms fina, lo que no permite que el huevo fertilizado se adhiera a la pared del tero. Los ACO son muy efectivos cuando se toman exactamente como se prescriben. Sin embargo, no previenen contra las enfermedades de transmisin sexual (ETS). La prctica del sexo seguro, como el uso de preservativos, junto con los ACO, [place] a prevenir ese tipo de enfermedades. Antes de tomar ACO, debe hacerse un examen fsico y un Papanicolau. El mdico podr indicarle anlisis de [place], si es necesario. El mdico se asegurar de que usted sea [place] buena candidata para usar anticonceptivos orales. Converse con su mdico acerca de los posibles efectos secundarios de los ACO que podran recetarle. Cuando se inicia el uso de ACO, se pueden tomar durante 2 a 3 meses para que el cuerpo se adapte a los cambios en los niveles hormonales en el cuerpo. CMO TOMAR LOS ANTICONCEPTIVOS ORALES El mdico le indicar como comenzar a Holiday representative de ACO. De lo contrario usted puede:  Journalist, newspaper de inicio del ciclo menstrual. No necesitar proteccin anticonceptiva adicional al Engineer, manufacturing.  Comenzar Software engineer domingo luego de su perodo menstrual, o Games developer en que adquiere el [employer]. En estos casos deber [employer]na proteccin anticonceptiva [employer] primeros 7 das del [place].  Comenzar a tomarlos en cualquier momento del ciclo. Si toma el anticonceptivo dentro de los 5 das de iniciado el perodo, Personnel officer protegida de quedar embarazada inmediatamente. En este caso, no necesitar una forma adicional de  anticonceptivos. Si comienza en cualquier otro momento del ciclo menstrual, necesitar usar otra forma de anticonceptivo durante 7 das. Si sus ACO son del tipo de los [employer], podrn impedir el embarazo despus de tomarlas por 2 das (48 horas). Luego de comenzar a tomar los ACO:  Si olvid de tomar 1 pldora, tmela tan pronto como lo recuerde. Tome la siguiente pldora a la hora habitual.  Si dej de tomar 2 o ms pldoras, comunquese con su mdico ya que diferentes pldoras tienen diferentes instrucciones para las dosis que no se han tomado. Si olvida tomar 2 o ms pldoras, utilice un mtodo anticonceptivo adicional hasta que comience su prximo perodo menstrual.  Si utiliza el envase de 28 pldoras que contienen pldoras inactivas y [place] tomar 1 de las ltimas 7 (pldoras sin hormonas), sto no tiene Education administrator. Simplemente deseche el resto de las pldoras que no contienen hormonas y comience un nuevo envase. No importa cuando comience a tomar los anticonceptivos, siempre empiece un nuevo envase el mismo da de la [place]. Tenga un envase extra de ACO y use un mtodo anticonceptivo adicional para Education officer, museum en que se olvide de tomar algunas pldoras o pierda la caja. INSTRUCCIONES PARA EL CUIDADO EN EL HOGAR  No fume.  Use siempre un condn para protegerse contra las enfermedades de transmisin sexual. Los ACO no protegen contra las enfermedades de transmisin sexual.  Use un almanaque para [employer] de su perodo menstrual.  Lea la informacin y consejos que vienen con las ACO. Hable con el profesional si tiene dudas.  SOLICITE ATENCIN MDICA SI:  Presenta nuseas  o vmitos.  Tiene flujo o sangrado vaginal anormal.  Aparece una erupcin cutnea.  No tiene el perodo menstrual.  Pierde el cabello.  Necesita tratamiento por cambios en su estado de nimo o por depresin.  Se siente mareada al [employer].  Comienza a aparecer acn con el uso de los  ACO.  Ardelle Anton.  SOLICITE ATENCIN MDICA DE INMEDIATO SI:  Siente dolor en el pecho.  Le falta el aire.  Le duele mucho la cabeza y no puede Human resources officer.  Siente adormecimiento o tiene dificultad para hablar.  Tiene problemas de visin.  Presenta dolor, inflamacin o hinchazn en las piernas.  Esta informacin no tiene Theme park manager el consejo del mdico. Asegrese de hacerle al mdico cualquier pregunta que tenga. Document Released: 03/19/2011 Document Revised: 07/22/2015 Document Reviewed: 09/18/2012 Elsevier Interactive Patient Education  2017 Elsevier Inc. Displasia cervical (Cervical Dysplasia) La displasia cervical es una afeccin que ocurre cuando una mujer presenta cambios anormales en las clulas del cuello del tero. El cuello del tero es la abertura del tero. Se encuentra entre la vagina y Careers information officer. La displasia cervical puede ser el primer signo de cncer de cuello del tero. Casi todos los casos de displasia cervical pueden curarse gracias a la deteccin temprana, el tratamiento y controles rigurosos. Si no se la trata, puede agravarse. CAUSAS Una infeccin causada por el virus del Geneticist, molecular (VPH) puede provocar la displasia cervical. FACTORES DE RIESGO  Haber padecido una enfermedad de transmisin sexual, como clamidia o infeccin por el VPH.  Ser sexualmente activa antes de los 18 aos.  Haber tenido ms de 1 compaero sexual.  No usar proteccin [employer] [place] [employer] sexuales, especialmente con compaeros sexuales nuevos.  Haber sufrido cncer en la vagina o en la vulva.  Haber tenido un compaero sexual cuya pareja anterior padeci cncer de cuello del tero o displasia cervical.  Tener un compaero sexual que tiene o ha tenido cncer de pene.  Presentar un debilitamiento del sistema inmunitario (a causa del VIH o de un trasplante de rgano).  Ser hija de una mujer que tom dietilestilbestrol (DES) durante el  embarazo.  Tener antecedentes familiares de cncer de cuello del tero.  El hbito de fumar.  SIGNOS Y SNTOMAS Generalmente, no se presentan sntomas. Si hay sntomas, estos pueden incluir:  Flujo vaginal anormal.  Sangrado entre perodos menstruales o despus de Sales promotion account executive.  Sangrado durante la menopausia.  Dolor durante las relaciones sexuales (dispareunia). DIAGNSTICO Se puede realizar una prueba de Papanicolaou. Durante esta prueba, se extraen clulas del cuello del tero y luego se analizan con un microscopio. Tambin puede realizarse una prueba en la que se extirpa tejido del cuello del tero (biopsia), si la prueba de Papanicolaou es anormal o si el aspecto del cuello del tero es anormal. TRATAMIENTO El tratamiento vara en funcin de la gravedad de la displasia cervical. El tratamiento puede incluir:  Crioterapia. Durante la crioterapia, las clulas anormales se congelan con un instrumento con punta de acero.  Un procedimiento para extirpar tejido anormal del cuello del tero.  Ciruga para extirpar tejido anormal. Generalmente, se lleva a cabo en casos graves de displasia cervical. Las opciones quirrgicas son: ? Biopsia en cono. Este es un procedimiento en el que se extirpan el canal cervical y [place]omia Dear parte del centro del cuello del tero. ? Histerectoma En esta ciruga se extirpan el tero y el cuello del tero. INSTRUCCIONES PARA EL CUIDADO EN EL HOGAR  Slo tome medicamentos  de venta libre o recetados para Primary school teachercalmar el dolor o Environmental health practitionerel malestar, segn las indicaciones de su mdico.  No utilice tampones, duchas vaginales ni tenga relaciones sexuales hasta que el profesional la autorice.  Cumpla con todas las visitas de control, segn le indique su mdico. Las mujeres que han recibido tratamiento para la displasia cervical deberan someterse a pruebas de Papanicolaou y exmenes plvicos de forma regular. Durante el primer ao despus del tratamiento de la  displasia cervical, la prueba de Papanicolaou se debe realizar cada 3 a 4 meses. En el segundo ao, se debe realizar cada 6 meses o segn lo recomendado por el mdico.  Para evitar la recurrencia de la afeccin, practique el sexo seguro.  SOLICITE ATENCIN MDICA SI: Presenta verrugas genitales. SOLICITE ATENCIN MDICA DE INMEDIATO SI:  El perodo menstrual es ms abundante que lo normal.  Presenta un sangrado rojo brillante, especialmente si tiene cogulos sanguneos.  Tiene fiebre.  Siente clicos o dolor que [place]umenta y no se alivia con medicamentos.  Se siente mareada, inusualmente dbil o se desmaya.  Tiene flujo vaginal anormal.  Siente dolor abdominal.  Esta informacin no tiene como fin reemplazar el consejo del mdico. Asegrese de hacerle al mdico cualquier pregunta que tenga. Document Released: 01/18/2013 Document Revised: 07/22/2015 Document Reviewed: 11/23/2012 Elsevier Interactive Patient Education  2017 [employer]vier [employer].

## 2018-03-09 ENCOUNTER — Other Ambulatory Visit: Payer: No Typology Code available for payment source

## 2018-03-21 ENCOUNTER — Other Ambulatory Visit: Payer: Self-pay

## 2018-03-21 DIAGNOSIS — Z3009 Encounter for other general counseling and advice on contraception: Secondary | ICD-10-CM

## 2018-03-21 MED ORDER — LEVONORGESTREL-ETHINYL ESTRAD 0.15-30 MG-MCG PO TABS: 1.0000 | ORAL_TABLET | Freq: Every day | ORAL | 11 refills | Status: DC

## 2018-03-29 ENCOUNTER — Telehealth (HOSPITAL_COMMUNITY): Payer: Self-pay | Admitting: *Deleted

## 2018-03-29 NOTE — Telephone Encounter (Signed)
Called patient with Spanish interpreter Nira ConnJulia Sowell to remind patient that she needs to reschedule her 6566-month right breast diagnostic mammogram. Patient stated she will schedule appointment and verbalized understanding.

## 2018-12-13 ENCOUNTER — Encounter (HOSPITAL_COMMUNITY): Payer: Self-pay | Admitting: Emergency Medicine

## 2018-12-13 ENCOUNTER — Emergency Department (HOSPITAL_COMMUNITY)
Admission: EM | Admit: 2018-12-13 | Discharge: 2018-12-13 | Disposition: A | Discharge status: Home / Self Care | Payer: Self-pay | Attending: Emergency Medicine | Admitting: Emergency Medicine

## 2018-12-13 ENCOUNTER — Other Ambulatory Visit: Payer: Self-pay

## 2018-12-13 ENCOUNTER — Emergency Department (HOSPITAL_COMMUNITY): Payer: Self-pay

## 2018-12-13 DIAGNOSIS — Z79899 Other long term (current) drug therapy: Secondary | ICD-10-CM | POA: Insufficient documentation

## 2018-12-13 DIAGNOSIS — J069 Acute upper respiratory infection, unspecified: Secondary | ICD-10-CM | POA: Insufficient documentation

## 2018-12-13 DIAGNOSIS — Z20828 Contact with and (suspected) exposure to other viral communicable diseases: Secondary | ICD-10-CM | POA: Insufficient documentation

## 2018-12-13 DIAGNOSIS — B9789 Other viral agents as the cause of diseases classified elsewhere: Secondary | ICD-10-CM | POA: Insufficient documentation

## 2018-12-13 LAB — GROUP A STREP BY PCR: Group A Strep by PCR: NOT DETECTED

## 2018-12-13 MED ORDER — BENZONATATE 100 MG PO CAPS: 100.0000 mg | ORAL_CAPSULE | Freq: Three times a day (TID) | ORAL | 0 refills | Status: DC

## 2018-12-13 NOTE — ED Provider Notes (Signed)
[facility] EMERGENCY DEPARTMENT Provider Note   CSN: [id number] Arrival date & time: 12/13/18  1054     History   Chief Complaint Chief Complaint  Patient presents with  . Cough  . Sore Throat    HPI Joann Clay is a 43 y.o. female who is previously healthy who presents with a one-week history of fever, cough, sore throat, headache.  Her cough is described as "chest congestion."  Patient recently traveled from [place].  She denies any chest pain, shortness of breath.  She did have a one episode of nonbloody looser stool yesterday, however that has not recurred.  She denies any vomiting or loss of taste or smell.  She has been taking over-the-counter medications for her fever.     The history is provided by the patient. The history is limited by a language barrier. A language interpreter was used.  Cough Associated symptoms: fever, headaches and sore throat   Associated symptoms: no chest pain, no chills, no rash and no shortness of breath   Sore Throat Associated symptoms include headaches. Pertinent negatives include no chest pain, no abdominal pain and no shortness of breath.    History reviewed. No pertinent past medical history.  There are no active problems to display for this patient.   History reviewed. No pertinent surgical history.   OB History    Gravida  6   Para      Term      Preterm      AB  3   Living  3     SAB  3   TAB      Ectopic      Multiple      Live Births  3            Home Medications    Prior to Admission medications   Medication Sig Start Date End Date Taking? Authorizing Provider  benzonatate (TESSALON) 100 MG capsule Take 1 capsule (100 mg total) by mouth every 8 (eight) hours. 12/13/18   Emi Holes, PA-C  levonorgestrel-ethinyl estradiol (PORTIA-28) 0.15-30 MG-MCG tablet Take 1 tablet by mouth daily. 03/21/18   Willodean Rosenthal, MD    Family History Family History  Problem  Relation Age of Onset  . Hypertension Mother     Social History Social History   Tobacco Use  . Smoking status: Current Some Day Smoker    Types: Cigarettes  . Smokeless tobacco: Never Used  Substance Use Topics  . Alcohol use: Yes    Frequency: Never    Comment: occasionally  . Drug use: No     Allergies   Patient has no known allergies.   Review of Systems Review of Systems  Constitutional: Positive for fever. Negative for chills.  HENT: Positive for sore throat. Negative for facial swelling.   Respiratory: Positive for cough. Negative for shortness of breath.   Cardiovascular: Negative for chest pain.  Gastrointestinal: Negative for abdominal pain, nausea and vomiting.  Genitourinary: Negative for dysuria.  Musculoskeletal: Negative for back pain.  Skin: Negative for rash and wound.  Neurological: Positive for headaches.  Psychiatric/Behavioral: The patient is not nervous/anxious.      Physical Exam Updated Vital Signs BP 115/72   Pulse 75   Temp 99 F (37.2 C) (Oral)   Resp 18   SpO2 100%   Physical Exam Vitals signs and nursing note reviewed.  Constitutional:      General: She is not in acute distress.  Appearance: She is well-developed. She is not diaphoretic.  HENT:     Head: Normocephalic and atraumatic.     Mouth/Throat:     Pharynx: No pharyngeal swelling, oropharyngeal exudate or posterior oropharyngeal erythema.     Tonsils: No tonsillar exudate or tonsillar abscesses.  Eyes:     General: No scleral icterus.       Right eye: No discharge.        Left eye: No discharge.     Conjunctiva/sclera: Conjunctivae normal.     Pupils: Pupils are equal, round, and reactive to light.  Neck:     Musculoskeletal: Normal range of motion and neck supple.     Thyroid: No thyromegaly.  Cardiovascular:     Rate and Rhythm: Normal rate and regular rhythm.     Heart sounds: Normal heart sounds. No murmur. No friction rub. No gallop.   Pulmonary:      Effort: Pulmonary effort is normal. No respiratory distress.     Breath sounds: Normal breath sounds. No stridor. No wheezing or rales.  Abdominal:     General: Bowel sounds are normal. There is no distension.     Palpations: Abdomen is soft.     Tenderness: There is no abdominal tenderness. There is no guarding or rebound.  Lymphadenopathy:     Cervical: No cervical adenopathy.  Skin:    General: Skin is warm and dry.     Coloration: Skin is not pale.     Findings: No rash.  Neurological:     Mental Status: She is alert.     Coordination: Coordination normal.      ED Treatments / Results  Labs (all labs ordered are listed, but only abnormal results are displayed) Labs Reviewed  GROUP A STREP BY PCR  NOVEL CORONAVIRUS, NAA (HOSP ORDER, SEND-OUT TO REF LAB; TAT 18-24 HRS)    EKG None  Radiology Dg Chest Portable 1 View  Result Date: 12/13/2018 CLINICAL DATA:  Headache, cough, sore throat and fever over the last 7 days. EXAM: PORTABLE CHEST 1 VIEW COMPARISON:  04/08/2017 FINDINGS: Allowing for the portable AP technique, heart and mediastinal shadows are normal. The lungs are clear. The vascularity is normal. No effusions. No abnormal bone finding. IMPRESSION: No active disease. Electronically Signed   By: Nelson Chimes M.D.   On: 12/13/2018 13:06    Procedures Procedures (including critical care time)  Medications Ordered in ED Medications - No data to display   Initial Impression / Assessment and Plan / ED Course  I have reviewed the triage vital signs and the nursing notes.  Pertinent labs & imaging results that were available during my care of the patient were reviewed by me and considered in my medical decision making (see chart for details).        Patient with suspected viral URI, possibly COVID-19.  COVID-19 send out pending.  Strep negative.  Chest x-ray is clear.  Will treat supportively with Tessalon, ibuprofen, Tylenol.  Rest, fluids, and home isolation  discussed.  Patient understands and agrees with plan.  Patient vitals stable throughout ED course and discharged in satisfactory condition.  Final Clinical Impressions(s) / ED Diagnoses   Final diagnoses:  Viral URI with cough    ED Discharge Orders         Ordered    benzonatate (TESSALON) 100 MG capsule  Every 8 hours     12/13/18 1441           Lynden Flemmer, [place], PA-C 12/13/18  [id number]    Alvira MondaySchlossman, Erin, MD 12/14/18 2059

## 2018-12-13 NOTE — ED Triage Notes (Signed)
Pt is primarily Spanish speaking, triage done using phone interpreter. Pt reports since last Monday she has had headache, cough, sore throat, and fever. Pt has not been able to take her temperature, but feels feverish. She reports taking 2 ibuprofen around 4 am today. Pt reports she recently traveled on a bus from [place] last Sunday 8/23.

## 2018-12-13 NOTE — Discharge Instructions (Addendum)
Take Tessalon every 8 hours as needed for cough.  Make sure to get plenty of rest and drink plenty of water.  You can alternate with ibuprofen and Tylenol as prescribed over-the-counter, as needed for headache and fever.  Please isolate until your symptoms and fever have resolved for 3 days.  Please return to the emergency department if you develop any new or worsening symptoms.

## 2018-12-14 LAB — NOVEL CORONAVIRUS, NAA (HOSP ORDER, SEND-OUT TO REF LAB; TAT 18-24 HRS): SARS-CoV-2, NAA: NOT DETECTED

## 2019-01-24 ENCOUNTER — Ambulatory Visit (INDEPENDENT_AMBULATORY_CARE_PROVIDER_SITE_OTHER): Payer: Self-pay | Admitting: Obstetrics & Gynecology

## 2019-01-24 ENCOUNTER — Other Ambulatory Visit: Payer: Self-pay

## 2019-01-24 DIAGNOSIS — N920 Excessive and frequent menstruation with regular cycle: Secondary | ICD-10-CM

## 2019-01-24 NOTE — Progress Notes (Signed)
   TELEHEALTH VIRTUAL GYNECOLOGY VISIT ENCOUNTER NOTE  I connected with Joann Clay on 01/24/19 at  8:55 AM EDT by telephone at home and verified that I am speaking with the correct person using two identifiers.   I discussed the limitations, risks, security and privacy concerns of performing an evaluation and management service by telephone and the availability of in person appointments. I also discussed with the patient that there may be a patient responsible charge related to this service. The patient expressed understanding and agreed to proceed.   History:  Joann Clay is a 43 y.o. [phone] female being evaluated today for heavy menses. She also reports pain that occurs in her vaginal area. Pt is worried because she had an abnormal PAP and a colpo.  When the pain occurs it radiates to her breast as well. She was on OCPs but, she stopped those 4-5 months ago because she is not sexually active.  she thinks that the pain and heavy bleeding were occuring on the pills as well. Pt reports that she is dizzy.      The following portions of the patient's history were reviewed and updated as appropriate: allergies, current medications, past family history, past medical history, past social history, past surgical history and problem list.   Health Maintenance: 08/31/2017 Cat 3 mammogram favor benign  Review of Systems:  Pertinent items noted in HPI and remainder of comprehensive ROS otherwise negative.  Physical Exam:   General:  Alert, oriented and cooperative.   Mental Status: Normal mood and affect perceived. Normal judgment and thought content.  Physical exam deferred due to nature of the encounter  Labs and Imaging 07/27/2017 PAP at [facility] LGSIL   09/20/2017 Diagnosis 1. Cervix, biopsy, 4-6 o'clock - LOW GRADE SQUAMOUS INTRAEPITHELIAL LESION (CIN1, MILD DYSPLASIA) - BENIGN ENDOCERVICAL GLANDULAR MUCOSA 2. Endocervix, curettage - BENIGN ENDOCERVICAL GLANDULAR EPITHELIUM  - NO SQUAMOUS EPITHELIUM IDENTIFIED - NO MALIGNANCY IDENTIFIED Assessment and Plan:     Menorrhagia- possibly symptomatic.  Prev abnormal mammogram - pt will come to the ofc to complete an application for the mammogram  F/u on LGSIL PAP and low grade dysplasia.   Pelvic US F/u for labs Needs ofc visit with pelvic exam to eval menorrhagia (this should occur AFTER the [place])      I discussed the assessment and treatment plan with the patient. The patient was provided an opportunity to ask questions and all were answered. The patient agreed with the plan and demonstrated an understanding of the instructions.   The patient was advised to call back or seek an in-person evaluation/go to the ED if the symptoms worsen or if the condition fails to improve as anticipated.  I provided 20 minutes of non-face-to-face time during this encounter.   Lavonia Drafts, MD [facility] [employer] [facility], [place]

## 2019-01-24 NOTE — Progress Notes (Signed)
I connected with  Joann Clay on 01/24/19 at  8:55 AM EDT with interpreter Eda by telephone and verified that I am speaking with the correct person using two identifiers.   I discussed the limitations, risks, security and privacy concerns of performing an evaluation and management service by telephone and the availability of in person appointments. I also discussed with the patient that there may be a patient responsible charge related to this service. The patient expressed understanding and agreed to proceed.  Annabell Howells, RN 01/24/2019  8:56 AM

## 2019-01-24 NOTE — Addendum Note (Signed)
Addended by: Shelly Coss on: 01/24/2019 09:26 AM   Modules accepted: Orders

## 2019-01-24 NOTE — Progress Notes (Signed)
Scheduled ultrasound for 10/20 @ 3pm

## 2019-01-25 ENCOUNTER — Telehealth (INDEPENDENT_AMBULATORY_CARE_PROVIDER_SITE_OTHER): Payer: Self-pay | Admitting: Lactation Services

## 2019-01-25 DIAGNOSIS — N920 Excessive and frequent menstruation with regular cycle: Secondary | ICD-10-CM

## 2019-01-25 LAB — CBC
Hematocrit: 38.2 % (ref 34.0–46.6)
Hemoglobin: 12.4 g/dL (ref 11.1–15.9)
MCH: 29.5 pg (ref 26.6–33.0)
MCHC: 32.5 g/dL (ref 31.5–35.7)
MCV: 91 fL (ref 79–97)
Platelets: 298 10*3/uL (ref 150–450)
RBC: 4.2 x10E6/uL (ref 3.77–5.28)
RDW: 12.3 % (ref 11.7–15.4)
WBC: 8.2 10*3/uL (ref 3.4–10.8)

## 2019-01-25 LAB — TSH: TSH: 0.696 u[IU]/mL (ref 0.450–4.500)

## 2019-01-25 NOTE — Telephone Encounter (Signed)
-----   Message from Lavonia Drafts, MD sent at 01/25/2019  8:42 AM EDT ----- Please call pt. She is not anemic!  She should still get her [place] and come in for an exam.  Thx,  clh-S

## 2019-01-25 NOTE — Telephone Encounter (Signed)
Called pt with assistance of [employer], Administrator, sports.   Pt was informed that Hgb is normal and that Dr. Ihor Dow wants her to follow up with her appts for [place] and GYN. Pt is aware of the dates and times of her follow up appts.

## 2019-01-31 ENCOUNTER — Other Ambulatory Visit: Payer: Self-pay

## 2019-01-31 ENCOUNTER — Ambulatory Visit (HOSPITAL_COMMUNITY)
Admission: RE | Admit: 2019-01-31 | Discharge: 2019-01-31 | Disposition: A | Discharge status: Home / Self Care | Payer: Self-pay | Source: Ambulatory Visit | Attending: Obstetrics & Gynecology | Admitting: Obstetrics & Gynecology

## 2019-01-31 DIAGNOSIS — N920 Excessive and frequent menstruation with regular cycle: Secondary | ICD-10-CM | POA: Insufficient documentation

## 2019-02-08 ENCOUNTER — Telehealth: Payer: Self-pay | Admitting: Lactation Services

## 2019-02-08 NOTE — Telephone Encounter (Signed)
-----   Message from Lavonia Drafts, MD sent at 02/08/2019 10:19 AM EDT ----- Please call pt. Her [place] was completely normal.  If she cont to have issues she can f/u to discuss tx options.    Thx,  Clh-S

## 2019-02-08 NOTE — Telephone Encounter (Signed)
Called pt with assistance of [employer], Administrator, sports. Pt did not answer. LM for pt to call the office for her results.

## 2019-02-09 ENCOUNTER — Telehealth: Payer: Self-pay

## 2019-02-09 NOTE — Telephone Encounter (Signed)
Called pt regarding U/S results, pt verbalized understanding. Also asked if she needed to keep appointment for tomorrow, I advised her yes.

## 2019-02-10 ENCOUNTER — Other Ambulatory Visit: Payer: Self-pay

## 2019-02-10 ENCOUNTER — Ambulatory Visit (INDEPENDENT_AMBULATORY_CARE_PROVIDER_SITE_OTHER): Payer: Self-pay | Admitting: Obstetrics & Gynecology

## 2019-02-10 ENCOUNTER — Encounter: Payer: Self-pay | Admitting: Obstetrics & Gynecology

## 2019-02-10 VITALS — BP 110/74 | HR 69 | Wt 167.9 lb

## 2019-02-10 DIAGNOSIS — N920 Excessive and frequent menstruation with regular cycle: Secondary | ICD-10-CM

## 2019-02-10 MED ORDER — NORGESTREL-ETHINYL ESTRADIOL 0.3-30 MG-MCG PO TABS: 1.0000 | ORAL_TABLET | Freq: Every day | ORAL | 11 refills | Status: DC

## 2019-02-10 NOTE — Progress Notes (Signed)
   Subjective:    Patient ID: Joann Clay, female    DOB: 05-Apr-1976, 43 y.o.   MRN: [id number]  HPI 43 yo single P3 (43, 87, 79 yo kids) here today to discuss heavy and painful periods, for about 6 months. She is using condoms but would like contraceptions. She used OCPs in the past. She cannot say if her periods were lighter. Occasional discomfort with sex.  Review of Systems Her u/s was normal.    Objective:   Physical Exam Breathing, conversing, and ambulating normally Well nourished, well hydrated Latina, no apparent distress  Live interpretor present for exam. Abd- benign     Assessment & Plan:  Heavy painful periods- restart OCPs Lo ovral prescribed. BP check in 4 weeks, start with NMP, rec back up method for 4 weeks H/o LGISL- she will get pap at [facility]

## 2019-02-17 ENCOUNTER — Other Ambulatory Visit (HOSPITAL_COMMUNITY): Payer: Self-pay | Admitting: *Deleted

## 2019-02-17 DIAGNOSIS — N6310 Unspecified lump in the right breast, unspecified quadrant: Secondary | ICD-10-CM

## 2019-03-16 ENCOUNTER — Ambulatory Visit (HOSPITAL_COMMUNITY): Payer: Self-pay

## 2019-03-16 ENCOUNTER — Other Ambulatory Visit: Payer: Self-pay

## 2019-04-03 ENCOUNTER — Other Ambulatory Visit: Payer: Self-pay

## 2019-04-03 ENCOUNTER — Emergency Department (HOSPITAL_COMMUNITY): Payer: Self-pay

## 2019-04-03 ENCOUNTER — Emergency Department (HOSPITAL_COMMUNITY): Admission: EM | Admit: 2019-04-03 | Discharge: 2019-04-03 | Discharge status: Absent without leave | Payer: Self-pay

## 2019-04-03 ENCOUNTER — Encounter (HOSPITAL_COMMUNITY): Payer: Self-pay | Admitting: Emergency Medicine

## 2019-04-03 ENCOUNTER — Emergency Department (HOSPITAL_COMMUNITY)
Admission: EM | Admit: 2019-04-03 | Discharge: 2019-04-03 | Disposition: A | Discharge status: Home / Self Care | Payer: Self-pay | Attending: Emergency Medicine | Admitting: Emergency Medicine

## 2019-04-03 DIAGNOSIS — R52 Pain, unspecified: Secondary | ICD-10-CM

## 2019-04-03 DIAGNOSIS — Z79899 Other long term (current) drug therapy: Secondary | ICD-10-CM | POA: Insufficient documentation

## 2019-04-03 DIAGNOSIS — F17210 Nicotine dependence, cigarettes, uncomplicated: Secondary | ICD-10-CM | POA: Insufficient documentation

## 2019-04-03 DIAGNOSIS — U071 COVID-19: Secondary | ICD-10-CM | POA: Insufficient documentation

## 2019-04-03 LAB — CBC
HCT: 42.3 % (ref 36.0–46.0)
Hemoglobin: 13.3 g/dL (ref 12.0–15.0)
MCH: 29.4 pg (ref 26.0–34.0)
MCHC: 31.4 g/dL (ref 30.0–36.0)
MCV: 93.4 fL (ref 80.0–100.0)
Platelets: 232 10*3/uL (ref 150–400)
RBC: 4.53 MIL/uL (ref 3.87–5.11)
RDW: 13.2 % (ref 11.5–15.5)
WBC: 4.1 10*3/uL (ref 4.0–10.5)
nRBC: 0 % (ref 0.0–0.2)

## 2019-04-03 LAB — URINALYSIS, ROUTINE W REFLEX MICROSCOPIC
Bacteria, UA: NONE SEEN
Bilirubin Urine: NEGATIVE
Glucose, UA: NEGATIVE mg/dL
Ketones, ur: NEGATIVE mg/dL
Leukocytes,Ua: NEGATIVE
Nitrite: NEGATIVE
Protein, ur: NEGATIVE mg/dL
Specific Gravity, Urine: 1.017 (ref 1.005–1.030)
pH: 5 (ref 5.0–8.0)

## 2019-04-03 LAB — COMPREHENSIVE METABOLIC PANEL
ALT: 30 U/L (ref 0–44)
AST: 29 U/L (ref 15–41)
Albumin: 3.7 g/dL (ref 3.5–5.0)
Alkaline Phosphatase: 45 U/L (ref 38–126)
Anion gap: 8 (ref 5–15)
BUN: 7 mg/dL (ref 6–20)
CO2: 20 mmol/L — ABNORMAL LOW (ref 22–32)
Calcium: 8.7 mg/dL — ABNORMAL LOW (ref 8.9–10.3)
Chloride: 108 mmol/L (ref 98–111)
Creatinine, Ser: 0.63 mg/dL (ref 0.44–1.00)
GFR calc Af Amer: >60 mL/min (ref >60)
GFR calc non Af Amer: >60 mL/min (ref >60)
Glucose, Bld: 81 mg/dL (ref 70–99)
Potassium: 3.9 mmol/L (ref 3.5–5.1)
Sodium: 136 mmol/L (ref 135–145)
Total Bilirubin: 0.2 mg/dL — ABNORMAL LOW (ref 0.3–1.2)
Total Protein: 6.6 g/dL (ref 6.5–8.1)

## 2019-04-03 LAB — POC SARS CORONAVIRUS 2 AG -  ED: SARS Coronavirus 2 Ag: POSITIVE — AB

## 2019-04-03 LAB — I-STAT BETA HCG BLOOD, ED (MC, WL, AP ONLY): I-stat hCG, quantitative: <5 m[IU]/mL (ref <5)

## 2019-04-03 LAB — LIPASE, BLOOD: Lipase: 21 U/L (ref 11–51)

## 2019-04-03 NOTE — ED Provider Notes (Signed)
[facility] Provider Note   CSN: [id number] Arrival date & time: 04/03/19  1522     History No chief complaint on file.   Joann Clay is a 43 y.o. female.  43 y.o female with no PMH presents to the ED with a chief complaint of body aches, nausea, diarrhea x 2 days. Patient reports her symptoms began with chills, dry cough since Tuesday, she reports a temperature however he does not have a thermometer for recorded measurement.  She also endorses a headache, this feels like " slightly hangover" she reports taking Tylenol to help with her symptoms without much improvement.  She also endorses diarrhea, had 2 episodes of nonbloody stool today.  Has had some nausea but no vomiting episodes.  She does endorse chills as well as generalized myalgias.  She is currently a 1 pack cigarette smoker daily.  Denies any chest pain, shortness of breath, prior cardiac history.     The history is provided by the patient.         OB History    Gravida  6   Para      Term      Preterm      AB  3   Living  3     SAB  3   TAB      Ectopic      Multiple      Live Births  3           Family History  Problem Relation Age of Onset  . Hypertension Mother     Social History   Tobacco Use  . Smoking status: Current Some Day Smoker    Types: Cigarettes  . Smokeless tobacco: Never Used  Substance Use Topics  . Alcohol use: Yes    Comment: occasionally  . Drug use: No    Home Medications Prior to Admission medications   Medication Sig Start Date End Date Taking? Authorizing Provider  benzonatate (TESSALON) 100 MG capsule Take 1 capsule (100 mg total) by mouth every 8 (eight) hours. Patient not taking: Reported on 01/24/2019 12/13/18   Emi HolesLaw, Alexandra M, PA-C  levonorgestrel-ethinyl estradiol (PORTIA-28) 0.15-30 MG-MCG tablet Take 1 tablet by mouth daily. Patient not taking: Reported on 01/24/2019 03/21/18   Willodean RosenthalHarraway-Smith,  Carolyn, MD  norgestrel-ethinyl estradiol (LO/OVRAL) 0.3-30 MG-MCG tablet Take 1 tablet by mouth daily. 02/10/19   Allie Bossierove, Myra C, MD    Allergies    Patient has no known allergies.  Review of Systems   Review of Systems  Constitutional: Positive for chills and fever.  HENT: Positive for sore throat.   Respiratory: Negative for shortness of breath and wheezing.   Cardiovascular: Negative for chest pain and leg swelling.  Gastrointestinal: Negative for abdominal pain, nausea and vomiting.  Musculoskeletal: Positive for myalgias.    Physical Exam Updated Vital Signs BP 123/79 (BP Location: Right Arm)   Pulse 77   Temp 98.2 F (36.8 C) (Oral)   Resp 14   SpO2 100%   Physical Exam Vitals and nursing note reviewed.  Constitutional:      General: She is not in acute distress.    Appearance: She is well-developed.  HENT:     Head: Normocephalic and atraumatic.     Mouth/Throat:     Pharynx: No oropharyngeal exudate.  Eyes:     Pupils: Pupils are equal, round, and reactive to light.  Cardiovascular:     Rate and Rhythm: Regular rhythm.  Heart sounds: Normal heart sounds.  Pulmonary:     Effort: Pulmonary effort is normal. No respiratory distress.     Breath sounds: Normal breath sounds.     Comments: Lungs are clear without any wheezing, rhonchi, rales. Abdominal:     General: Bowel sounds are normal. There is no distension.     Palpations: Abdomen is soft.     Tenderness: There is no abdominal tenderness.     Comments: Abdomen is soft, nontender to palpation, bowel sounds are present.  Musculoskeletal:        General: No tenderness or deformity.     Cervical back: Normal range of motion.     Right lower leg: No edema.     Left lower leg: No edema.  Skin:    General: Skin is warm and dry.  Neurological:     Mental Status: She is alert and oriented to person, place, and time.     Motor: No pronator drift.     Comments: No facial asymmetry, dysarthria, weakness on my  exam.     ED Results / Procedures / Treatments   Labs (all labs ordered are listed, but only abnormal results are displayed) Labs Reviewed  COMPREHENSIVE METABOLIC PANEL - Abnormal; Notable for the following components:      Result Value   CO2 20 (*)    Calcium 8.7 (*)    Total Bilirubin 0.2 (*)    All other components within normal limits  URINALYSIS, ROUTINE W REFLEX MICROSCOPIC - Abnormal; Notable for the following components:   Hgb urine dipstick SMALL (*)    All other components within normal limits  POC SARS CORONAVIRUS 2 AG -  ED - Abnormal; Notable for the following components:   SARS Coronavirus 2 Ag POSITIVE (*)    All other components within normal limits  CBC  LIPASE, BLOOD  I-STAT BETA HCG BLOOD, ED (MC, [facility], AP ONLY)    EKG None  Radiology DG Chest Portable 1 View  Result Date: 04/03/2019 CLINICAL DATA:  43 year old female with shortness of breath. EXAM: PORTABLE CHEST 1 VIEW COMPARISON:  Chest radiograph dated 12/13/2018. FINDINGS: The heart size and mediastinal contours are within normal limits. Both lungs are clear. The visualized skeletal structures are unremarkable. IMPRESSION: No active disease. Electronically Signed   By: Anner Crete M.D.   On: 04/03/2019 17:14    Procedures Procedures (including critical care time)  Medications Ordered in ED Medications - No data to display  ED Course  I have reviewed the triage vital signs and the nursing notes.  Pertinent labs & imaging results that were available during my care of the patient were reviewed by me and considered in my medical decision making (see chart for details).  Clinical Course as of Apr 02 1837  Mon Apr 03, 2019  1807 SARS Coronavirus 2 Ag(!): POSITIVE [JS]    Clinical Course User Index [JS] Janeece Fitting, PA-C   MDM Rules/Calculators/A&P    Patient with no past medical history presents to the ED with complaints of body aches, chills, fever, sore throat for the past 3 days.   Patient reports her symptoms began Tuesday however they worsened in the past 3 days.  She says overall body aches, has had a fever although unknown temperature, she has been taking Tylenol without improvement in symptoms.  He is currently a 1 pack of cigarette smoker daily, she arrived in the ED satting at 100% on room air, able to speak in full sentences, no tachypnea,  fever.  Normotensive and no tachycardia.  CBC without any leukocytosis, no signs of anemia.  Lipase level is within normal limits.  CMP no electrolyte abnormality, creatinine level is within normal limits.  LFTs are unremarkable.  hCG is negative.  UA with a small amount of hemoglobin denies any urinary symptoms on today's visit, no signs of infection.  SARS corona is positive on today's visit.  She is a current daily pot smoker, chest x-ray was obtained to rule out any pneumonia.  Chest x-ray did not show any consolidation, pneumothorax, pleural effusion.  Patient is otherwise oxygenating well, no hypoxia, tachycardia.  PERC negative.  We will have patient treat symptoms at home, she was given strict return precautions in Spanish.  She is overall well-appearing.  Patient understands and agrees with management, return precautions discussed at length.   Joann Clay was evaluated in Emergency Department on 04/03/2019 for the symptoms described in the history of present illness. She was evaluated in the context of the global COVID-19 pandemic, which necessitated consideration that the patient might be at risk for infection with the SARS-CoV-2 virus that causes COVID-19. Institutional protocols and algorithms that pertain to the evaluation of patients at risk for COVID-19 are in a state of rapid change based on information released by regulatory bodies including the CDC and federal and state organizations. These policies and algorithms were followed during the patient's care in the ED.   Portions of this note were generated with  Scientist, clinical (histocompatibility and immunogenetics). Dictation errors may occur despite best attempts at proofreading.  Final Clinical Impression(s) / ED Diagnoses Final diagnoses:  Generalized body aches  COVID-19 virus infection    Rx / DC Orders ED Discharge Orders    None       Claude Manges, PA-C 04/03/19 1843    Sabas Sous, MD 04/04/19 [phone]

## 2019-04-03 NOTE — ED Triage Notes (Signed)
Pt reports having a fever, poor appetite and back pain since Thursday. Reports diarrhea starting today.

## 2019-04-03 NOTE — Discharge Instructions (Signed)
Su resultado de Covid 19 fue positivo.  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 Puede tomar tylenol para sus simptomas.  Si siente dolor de pecho, que le falta la respiracion por favor regrese a casa.

## 2019-04-03 NOTE — ED Notes (Signed)
Pt discharge instructions reviewed with the patient. Pt verbalized understanding of discharge instructions. Pt discharged. 

## 2019-05-04 ENCOUNTER — Other Ambulatory Visit: Payer: Self-pay

## 2019-05-04 ENCOUNTER — Ambulatory Visit (HOSPITAL_COMMUNITY): Payer: Self-pay

## 2019-06-08 ENCOUNTER — Ambulatory Visit (HOSPITAL_COMMUNITY): Payer: Self-pay

## 2019-06-13 ENCOUNTER — Ambulatory Visit: Payer: No Typology Code available for payment source | Admitting: Advanced Practice Midwife

## 2019-06-13 ENCOUNTER — Ambulatory Visit
Admission: RE | Admit: 2019-06-13 | Discharge: 2019-06-13 | Disposition: A | Discharge status: Home / Self Care | Payer: No Typology Code available for payment source | Source: Ambulatory Visit | Attending: Obstetrics and Gynecology | Admitting: Obstetrics and Gynecology

## 2019-06-13 ENCOUNTER — Other Ambulatory Visit: Payer: Self-pay

## 2019-06-13 VITALS — BP 122/80 | Temp 97.3°F | Wt 171.0 lb

## 2019-06-13 DIAGNOSIS — N6310 Unspecified lump in the right breast, unspecified quadrant: Secondary | ICD-10-CM

## 2019-06-13 DIAGNOSIS — Z01419 Encounter for gynecological examination (general) (routine) without abnormal findings: Secondary | ICD-10-CM

## 2019-06-13 DIAGNOSIS — Z1239 Encounter for other screening for malignant neoplasm of breast: Secondary | ICD-10-CM

## 2019-06-13 NOTE — Progress Notes (Signed)
Ms. Joann Clay is a 44 y.o. [phone] female who presents to [facility] clinic today with no complaints.    Pap Smear: Pap smear completed today. Last Pap smear was 07/2017 at [facility] clinic and was abnormal - LGSIL with colpo CIN1. Per patient has history of an abnormal Pap smear. Last Pap smear result is not available in Epic.   Physical exam: Breasts Breasts symmetrical. No skin abnormalities bilateral breasts. No nipple retraction bilateral breasts. No nipple discharge bilateral breasts. No lymphadenopathy. No lumps palpated bilateral breasts.       Pelvic/Bimanual Ext Genitalia No lesions, no swelling and no discharge observed on external genitalia.        Vagina Vagina pink and normal texture. No lesions or discharge observed in vagina.        Cervix Cervix is present. Cervix pink and of normal texture. No discharge observed.    Uterus Uterus is present and palpable. Uterus in normal position and normal size.        Adnexae Bilateral ovaries present and palpable. No tenderness on palpation.         Rectovaginal No rectal exam completed today since patient had no rectal complaints. No skin abnormalities observed on exam.     Smoking History: Patient has is a current smoker at 1/2 packs per day referred to quit line.    Patient Navigation: Patient education provided. Access to services provided for patient through [facility] program. Spanish interpreter provided.   Colorectal Cancer Screening: Per patient has never had colonoscopy completed No complaints today.    Breast and Cervical Cancer Risk Assessment: Patient does not have family history of breast cancer, known genetic mutations, or radiation treatment to the chest before age 53. Patient has history of cervical dysplasia, immunocompromised, or DES exposure in-utero.  Risk Assessment    Risk Scores      06/13/2019 08/31/2017   Last edited by: Narda Rutherford, LPN Stoney Bang H, LPN   5-year risk: 0.4 % 0.4 %   Lifetime risk: 5.6 % 5.7 %          A: BCCCP exam with pap smear Complaint of none  P: Referred patient to [facility] for a diagnostic mammogram. Appointment scheduled 06/13/2019 at 9:40am.  Thressa Sheller DNP, CNM  06/13/19  8:49 AM

## 2019-06-13 NOTE — Addendum Note (Signed)
Addended by: Narda Rutherford on: 06/13/2019 09:43 AM   Modules accepted: Orders

## 2019-06-14 LAB — CYTOLOGY - PAP
Comment: NEGATIVE
Diagnosis: NEGATIVE
High risk HPV: NEGATIVE

## 2020-05-16 ENCOUNTER — Other Ambulatory Visit: Payer: Self-pay | Admitting: Obstetrics and Gynecology

## 2020-05-16 DIAGNOSIS — Z1231 Encounter for screening mammogram for malignant neoplasm of breast: Secondary | ICD-10-CM

## 2020-06-20 ENCOUNTER — Ambulatory Visit: Payer: Self-pay

## 2021-07-03 ENCOUNTER — Emergency Department (HOSPITAL_COMMUNITY): Payer: 59

## 2021-07-03 ENCOUNTER — Encounter (HOSPITAL_COMMUNITY): Payer: Self-pay | Admitting: Pharmacy Technician

## 2021-07-03 ENCOUNTER — Emergency Department (HOSPITAL_COMMUNITY)
Admission: EM | Admit: 2021-07-03 | Discharge: 2021-07-03 | Disposition: A | Discharge status: Home / Self Care | Payer: 59 | Attending: Emergency Medicine | Admitting: Emergency Medicine

## 2021-07-03 ENCOUNTER — Other Ambulatory Visit: Payer: Self-pay

## 2021-07-03 DIAGNOSIS — R1084 Generalized abdominal pain: Secondary | ICD-10-CM | POA: Diagnosis not present

## 2021-07-03 DIAGNOSIS — R3915 Urgency of urination: Secondary | ICD-10-CM | POA: Insufficient documentation

## 2021-07-03 DIAGNOSIS — R509 Fever, unspecified: Secondary | ICD-10-CM | POA: Diagnosis not present

## 2021-07-03 DIAGNOSIS — M791 Myalgia, unspecified site: Secondary | ICD-10-CM | POA: Insufficient documentation

## 2021-07-03 DIAGNOSIS — M546 Pain in thoracic spine: Secondary | ICD-10-CM | POA: Insufficient documentation

## 2021-07-03 DIAGNOSIS — M545 Low back pain, unspecified: Secondary | ICD-10-CM | POA: Diagnosis not present

## 2021-07-03 DIAGNOSIS — R059 Cough, unspecified: Secondary | ICD-10-CM | POA: Diagnosis not present

## 2021-07-03 LAB — COMPREHENSIVE METABOLIC PANEL
ALT: 23 U/L (ref 0–44)
AST: 22 U/L (ref 15–41)
Albumin: 3.8 g/dL (ref 3.5–5.0)
Alkaline Phosphatase: 49 U/L (ref 38–126)
Anion gap: 8 (ref 5–15)
BUN: 7 mg/dL (ref 6–20)
CO2: 25 mmol/L (ref 22–32)
Calcium: 9.1 mg/dL (ref 8.9–10.3)
Chloride: 105 mmol/L (ref 98–111)
Creatinine, Ser: 0.67 mg/dL (ref 0.44–1.00)
GFR, Estimated: >60 mL/min (ref >60)
Glucose, Bld: 110 mg/dL — ABNORMAL HIGH (ref 70–99)
Potassium: 4 mmol/L (ref 3.5–5.1)
Sodium: 138 mmol/L (ref 135–145)
Total Bilirubin: 0.4 mg/dL (ref 0.3–1.2)
Total Protein: 7.2 g/dL (ref 6.5–8.1)

## 2021-07-03 LAB — CBC WITH DIFFERENTIAL/PLATELET
Abs Immature Granulocytes: 0.02 10*3/uL (ref 0.00–0.07)
Basophils Absolute: 0.1 10*3/uL (ref 0.0–0.1)
Basophils Relative: 1 %
Eosinophils Absolute: 0.2 10*3/uL (ref 0.0–0.5)
Eosinophils Relative: 4 %
HCT: 41.4 % (ref 36.0–46.0)
Hemoglobin: 13.1 g/dL (ref 12.0–15.0)
Immature Granulocytes: 0 %
Lymphocytes Relative: 27 %
Lymphs Abs: 1.6 10*3/uL (ref 0.7–4.0)
MCH: 30 pg (ref 26.0–34.0)
MCHC: 31.6 g/dL (ref 30.0–36.0)
MCV: 95 fL (ref 80.0–100.0)
Monocytes Absolute: 1 10*3/uL (ref 0.1–1.0)
Monocytes Relative: 16 %
Neutro Abs: 3 10*3/uL (ref 1.7–7.7)
Neutrophils Relative %: 52 %
Platelets: 248 10*3/uL (ref 150–400)
RBC: 4.36 MIL/uL (ref 3.87–5.11)
RDW: 13.3 % (ref 11.5–15.5)
WBC: 5.9 10*3/uL (ref 4.0–10.5)
nRBC: 0 % (ref 0.0–0.2)

## 2021-07-03 LAB — I-STAT BETA HCG BLOOD, ED (MC, WL, AP ONLY): I-stat hCG, quantitative: 7.7 m[IU]/mL — ABNORMAL HIGH (ref <5)

## 2021-07-03 LAB — URINALYSIS, ROUTINE W REFLEX MICROSCOPIC
Bilirubin Urine: NEGATIVE
Glucose, UA: NEGATIVE mg/dL
Ketones, ur: NEGATIVE mg/dL
Nitrite: NEGATIVE
Protein, ur: 100 mg/dL — AB
RBC / HPF: >50 RBC/hpf — ABNORMAL HIGH (ref 0–5)
Specific Gravity, Urine: 1.024 (ref 1.005–1.030)
pH: 5 (ref 5.0–8.0)

## 2021-07-03 LAB — POC URINE PREG, ED: Preg Test, Ur: NEGATIVE

## 2021-07-03 LAB — LIPASE, BLOOD: Lipase: 25 U/L (ref 11–51)

## 2021-07-03 NOTE — ED Provider Triage Note (Signed)
Emergency Medicine Provider Triage Evaluation Note ? ?Joann Clay , a 46 y.o. female  was evaluated in triage.  Pt complains of complains of upper back pain x several months. Reports it is now radiating into her abdomen. She complains of urinary frequency and high fever yesterday - did not check her temperature. ? ?Review of Systems  ?Positive: + back pain, abd pain, fever, urinary frequency ?Negative:  ? ?Physical Exam  ?BP 117/80 (BP Location: Right Arm)   Pulse 74   Temp 99.6 ?F (37.6 ?C) (Oral)   Resp 14   SpO2 99%  ?Gen:   Awake, no distress   ?Resp:  Normal effort  ?MSK:   Moves extremities without difficulty  ?Other:   ? ?Medical Decision Making  ?Medically screening exam initiated at 2:23 PM.  Appropriate orders placed.  Joann Clay was informed that the remainder of the evaluation will be completed by another provider, this initial triage assessment does not replace that evaluation, and the importance of remaining in the ED until their evaluation is complete. ? ? ?  ?Tanda Rockers, PA-C ?07/03/21 1424 ? ?

## 2021-07-03 NOTE — ED Notes (Signed)
Pt verbalized understanding of d/c instructions, meds, and followup care. Denies questions. VSS, no distress noted. Steady gait to exit with all belongings.  ?

## 2021-07-03 NOTE — Discharge Instructions (Addendum)
There was no obvious concerning finding on your work-up here.  Your CT scan did not show any obvious problem inside the belly or the pelvis.  No obvious bony problem.  No kidney stones.  We will treat this as musculoskeletal pain.  Tylenol and ibuprofen at home.  PCP follow-up. ? ?Take 4 over the counter ibuprofen tablets 3 times a day or 2 over-the-counter naproxen tablets twice a day for pain. ?Also take tylenol 1000mg (2 extra strength) four times a day.  ? ?No hubo ning?n hallazgo evidente en su trabajo aqu?. Su tomograf?a computarizada no mostr? ning?n problema evidente dentro del abdomen o la pelvis. Ning?n problema ?seo evidente. Sin c?lculos renales. Trataremos esto como dolor musculoesquel?tico. Tylenol e ibuprofeno en casa. Seguimiento de PCP. ? ?Tome 4 tabletas de ibuprofeno de venta libre 3 veces al d?a o 2 tabletas de naproxeno de venta [employer] al d?a para Conservation officer, historic buildings. ?Tambi?n tome tylenol 1000 mg (2 extra fuerte) cuatro veces al d?a. ?  ? ?

## 2021-07-03 NOTE — ED Notes (Signed)
Walked to minilab to verify POC upreg is being run. ?

## 2021-07-03 NOTE — ED Triage Notes (Signed)
Pt here with pain to lower back pain along with abdominal pain onset a week ago with worsening last night. Pt also developed fevers last night. Endorses painful urination.  ?

## 2021-07-03 NOTE — ED Provider Notes (Signed)
?[facility] EMERGENCY DEPARTMENT ?Provider Note ? ? ?CSN: [id number] ?Arrival date & time: 07/03/21  1318 ? ?  ? ?History ? ?Chief Complaint  ?Patient presents with  ? Back Pain  ? Abdominal Pain  ? ? ?Joann Clay is a 46 y.o. female. ? ?46 y.o female with no PMH presents to the ED with altered bowel complaints.  Patient reports her symptoms began a few days ago starting with some back pain, dry cough, body aches.  She does report taking NyQuil for her cough without much improvement in symptoms.  She is also endorsing a very high fever last night, although this is subjective as patient does not own a thermometer.  She is also endorsing urinary urgency but no dysuria or hematuria.  She does not have any prior primary care.  She denies any chest pain, shortness of breath, or gynecological complaints.  ?Last bowel movement yesterday evening which was normal.  ?Of note, patient does endorse 1 pack a day of tobacco use. ? ?The history is provided by the patient and medical records.  ?Back Pain ?Location:  Thoracic spine ?Quality:  Aching ?Duration:  1 day ?Timing:  Constant ?Progression:  Worsening ?Chronicity:  New ?Relieved by:  Nothing ?Associated symptoms: abdominal pain and fever   ?Associated symptoms: no chest pain, no dysuria and no headaches   ?Abdominal Pain ?Associated symptoms: chills and fever   ?Associated symptoms: no chest pain, no dysuria, no nausea, no shortness of breath, no sore throat and no vomiting   ? ?  ? ?Home Medications ?Prior to Admission medications   ?Medication Sig Start Date End Date Taking? Authorizing Provider  ?benzonatate (TESSALON) 100 MG capsule Take 1 capsule (100 mg total) by mouth every 8 (eight) hours. ?Patient not taking: Reported on 01/24/2019 12/13/18   Emi Holes, PA-C  ?levonorgestrel-ethinyl estradiol (PORTIA-28) 0.15-30 MG-MCG tablet Take 1 tablet by mouth daily. ?Patient not taking: Reported on 01/24/2019 03/21/18   Willodean Rosenthal,  MD  ?norgestrel-ethinyl estradiol (LO/OVRAL) 0.3-30 MG-MCG tablet Take 1 tablet by mouth daily. ?Patient not taking: Reported on 06/13/2019 02/10/19   Allie Bossier, MD  ?   ? ?Allergies    ?Patient has no known allergies.   ? ?Review of Systems   ?Review of Systems  ?Constitutional:  Positive for chills and fever.  ?HENT:  Positive for sinus pressure. Negative for sore throat.   ?Respiratory:  Negative for shortness of breath.   ?Cardiovascular:  Negative for chest pain and leg swelling.  ?Gastrointestinal:  Positive for abdominal pain. Negative for nausea and vomiting.  ?Genitourinary:  Positive for urgency. Negative for dysuria.  ?Musculoskeletal:  Positive for back pain.  ?Skin:  Negative for wound.  ?Neurological:  Negative for light-headedness and headaches.  ?All other systems reviewed and are negative. ? ?Physical Exam ?Updated Vital Signs ?BP 136/90   Pulse 78   Temp 98.4 ?F (36.9 ?C) (Oral)   Resp 16   SpO2 100%  ?Physical Exam ?Vitals and nursing note reviewed.  ?Constitutional:   ?   Appearance: She is well-developed.  ?HENT:  ?   Head: Normocephalic and atraumatic.  ?Cardiovascular:  ?   Rate and Rhythm: Normal rate.  ?Pulmonary:  ?   Effort: Pulmonary effort is normal.  ?   Breath sounds: Rales present.  ?Abdominal:  ?   General: Abdomen is flat. Bowel sounds are decreased.  ?   Palpations: Abdomen is soft.  ?   Tenderness: There is generalized abdominal tenderness. There  is no right CVA tenderness or left CVA tenderness.  ?Skin: ?   General: Skin is warm and dry.  ?Neurological:  ?   Mental Status: She is alert and oriented to person, place, and time.  ? ? ?ED Results / Procedures / Treatments   ?Labs ?(all labs ordered are listed, but only abnormal results are displayed) ?Labs Reviewed  ?COMPREHENSIVE METABOLIC PANEL - Abnormal; Notable for the following components:  ?    Result Value  ? Glucose, Bld 110 (*)   ? All other components within normal limits  ?URINALYSIS, ROUTINE W REFLEX MICROSCOPIC -  Abnormal; Notable for the following components:  ? APPearance CLOUDY (*)   ? Hgb urine dipstick LARGE (*)   ? Protein, ur 100 (*)   ? Leukocytes,Ua LARGE (*)   ? RBC / HPF >50 (*)   ? Bacteria, UA FEW (*)   ? All other components within normal limits  ?I-STAT BETA HCG BLOOD, ED (MC, WL, AP ONLY) - Abnormal; Notable for the following components:  ? I-stat hCG, quantitative 7.7 (*)   ? All other components within normal limits  ?CBC WITH DIFFERENTIAL/PLATELET  ?LIPASE, BLOOD  ?POC URINE PREG, ED  ? ? ?EKG ?None ? ?Radiology ?No results found. ? ?Procedures ?Procedures  ? ? ?Medications Ordered in ED ?Medications - No data to display ? ?ED Course/ Medical Decision Making/ A&P ?Clinical Course as of 07/12/21 1006  ?Thu Jul 03, 2021  ?1837 Leukocytes,Ua(!): LARGE [JS]  ?1837 WBC, UA: 11-20 [JS]  ?  ?Clinical Course User Index ?[JS] Claude Manges, PA-C  ? ?                        ?Medical Decision Making ?Amount and/or Complexity of Data Reviewed ?Labs:  Decision-making details documented in ED Course. ?Radiology: ordered. ? ? ?This patient presents to the ED for concern of Flank pain, this involves a number of treatment options, and is a complaint that carries with it a high risk of complications and morbidity.  The differential diagnosis includes renal colic, nephrolithiasis, pneumonia. ? ? ?Co morbidities: ?Discussed in HPI ? ? ?Brief History: ? ?Patient under no primary care presents to the ED with a chief complaint of bilateral flank pain, urinary urgency, cough along with "high fever since last night.  Patient denies any sick contacts.  She does not have any primary care.  Last menstrual cycle approximately a month ago.  Arrived in the ED with stable vital signs and a temperature of 99.6, has been taking NyQuil without much improvement in symptoms.  Unknown COVID-19 and influenza vaccinations. ? ?EMR reviewed including pt PMHx, past surgical history and past visits to ER.  ? ?See HPI for more details ? ? ?Lab  Tests: ? ?I ordered and independently interpreted labs.  The pertinent results include:   ? ?I personally reviewed all laboratory work and imaging. Metabolic panel without any acute abnormality specifically kidney function within normal limits and no significant electrolyte abnormalities. CBC without leukocytosis or significant anemia. Lipase is within normal limits ? ? ?Imaging Studies: ? ?NAD. I personally reviewed all imaging studies and no acute abnormality found. I agree with radiology interpretation. ? ? ?Reevaluation: ? ?After the interventions noted above I re-evaluated patient and found that they have :stayed the same ? ? ?Social Determinants of Health: ? ?The patient's social determinants of health were a factor in the care of this patient ? ? ? ?Problem List / ED Course: ? ?  Patient here with double complaints including back pain, dry cough, body aches taking NyQuil without any improvement in her symptoms.  Also endorses no abdominal pain.  Denies urinary symptoms.  Labs are with normal limits on the visit.  She is not pregnant at this time.  UA with large blood, large leukocytes however she is endorsing any urinary symptoms on today's visit I do not feel she is to be treated for UTI at this point. ? ? ?Dispostion: ? ?Patient care signed out to Dr. Adela LankFloyd pending CT renal to rule out kidney stones. ? ? ?Portions of this note were generated with Scientist, clinical (histocompatibility and immunogenetics)Dragon dictation software. Dictation errors may occur despite best attempts at proofreading.   ?Final Clinical Impression(s) / ED Diagnoses ?Final diagnoses:  ?Acute low back pain, unspecified back pain laterality, unspecified whether sciatica present  ? ? ?Rx / DC Orders ?ED Discharge Orders   ? ? None  ? ?  ? ? ?  ?Claude MangesSoto, Truth Wolaver, PA-C ?07/12/21 1006 ? ?  ?Melene PlanFloyd, Dan, DO ?07/12/21 1458 ? ?

## 2022-07-04 IMAGING — DX DG CHEST 1V PORT
1 series · 1 of 1 positions shown · non-contrast
Comparison: 04/03/2019

CLINICAL DATA: Short of breath, back and abdominal pain

EXAM:
PORTABLE CHEST 1 VIEW

[chest ap]
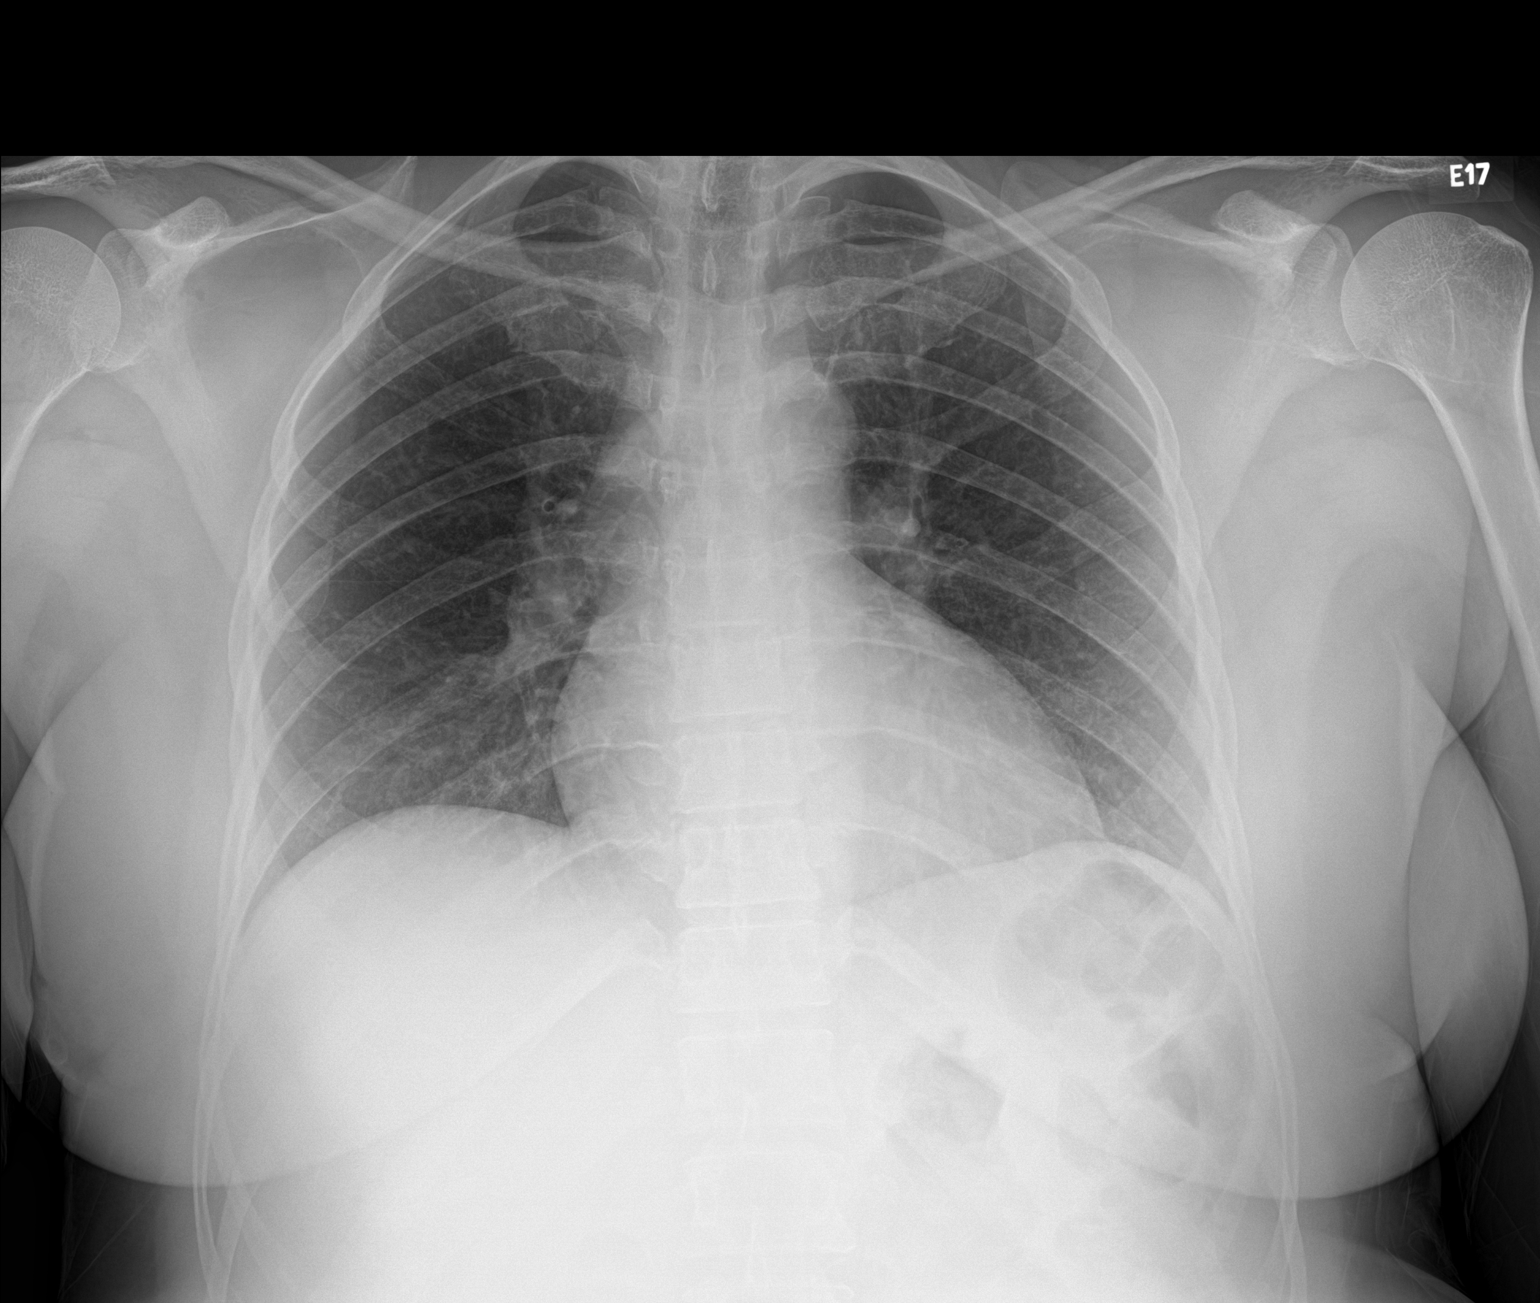

[1 of 1 positions shown; findings below may reference images not displayed]

FINDINGS: The heart size and mediastinal contours are within normal limits.
Both lungs are clear. The visualized skeletal structures are
unremarkable.
IMPRESSION: No active disease.

## 2022-09-09 ENCOUNTER — Encounter (HOSPITAL_COMMUNITY): Payer: Self-pay

## 2022-09-09 ENCOUNTER — Other Ambulatory Visit: Payer: Self-pay

## 2022-09-09 ENCOUNTER — Emergency Department (HOSPITAL_COMMUNITY): Payer: Self-pay

## 2022-09-09 ENCOUNTER — Encounter (HOSPITAL_COMMUNITY): Payer: Self-pay | Admitting: Pharmacy Technician

## 2022-09-09 ENCOUNTER — Emergency Department (HOSPITAL_COMMUNITY)
Admission: EM | Admit: 2022-09-09 | Discharge: 2022-09-09 | Disposition: A | Discharge status: Home / Self Care | Payer: Self-pay | Attending: Emergency Medicine | Admitting: Emergency Medicine

## 2022-09-09 DIAGNOSIS — F10920 Alcohol use, unspecified with intoxication, uncomplicated: Secondary | ICD-10-CM | POA: Insufficient documentation

## 2022-09-09 DIAGNOSIS — Y902 Blood alcohol level of 40-59 mg/100 ml: Secondary | ICD-10-CM | POA: Insufficient documentation

## 2022-09-09 DIAGNOSIS — M542 Cervicalgia: Secondary | ICD-10-CM | POA: Insufficient documentation

## 2022-09-09 LAB — CBC WITH DIFFERENTIAL/PLATELET
Abs Immature Granulocytes: 0.01 10*3/uL (ref 0.00–0.07)
Basophils Absolute: 0.1 10*3/uL (ref 0.0–0.1)
Basophils Relative: 1 %
Eosinophils Absolute: 0.1 10*3/uL (ref 0.0–0.5)
Eosinophils Relative: 1 %
HCT: 43.7 % (ref 36.0–46.0)
Hemoglobin: 13.8 g/dL (ref 12.0–15.0)
Immature Granulocytes: 0 %
Lymphocytes Relative: 16 %
Lymphs Abs: 1.6 10*3/uL (ref 0.7–4.0)
MCH: 29.1 pg (ref 26.0–34.0)
MCHC: 31.6 g/dL (ref 30.0–36.0)
MCV: 92.2 fL (ref 80.0–100.0)
Monocytes Absolute: 0.9 10*3/uL (ref 0.1–1.0)
Monocytes Relative: 9 %
Neutro Abs: 7.1 10*3/uL (ref 1.7–7.7)
Neutrophils Relative %: 73 %
Platelets: 303 10*3/uL (ref 150–400)
RBC: 4.74 MIL/uL (ref 3.87–5.11)
RDW: 13.7 % (ref 11.5–15.5)
WBC: 9.8 10*3/uL (ref 4.0–10.5)
nRBC: 0 % (ref 0.0–0.2)

## 2022-09-09 LAB — COMPREHENSIVE METABOLIC PANEL
ALT: 27 U/L (ref 0–44)
AST: 23 U/L (ref 15–41)
Albumin: 4.1 g/dL (ref 3.5–5.0)
Alkaline Phosphatase: 61 U/L (ref 38–126)
Anion gap: 11 (ref 5–15)
BUN: 6 mg/dL (ref 6–20)
CO2: 18 mmol/L — ABNORMAL LOW (ref 22–32)
Calcium: 8.6 mg/dL — ABNORMAL LOW (ref 8.9–10.3)
Chloride: 110 mmol/L (ref 98–111)
Creatinine, Ser: 0.76 mg/dL (ref 0.44–1.00)
GFR, Estimated: 52 mL/min — ABNORMAL LOW (ref >60)
Glucose, Bld: 83 mg/dL (ref 70–99)
Potassium: 3.8 mmol/L (ref 3.5–5.1)
Sodium: 139 mmol/L (ref 135–145)
Total Bilirubin: 0.9 mg/dL (ref 0.3–1.2)
Total Protein: 8.3 g/dL — ABNORMAL HIGH (ref 6.5–8.1)

## 2022-09-09 LAB — HCG, QUANTITATIVE, PREGNANCY: hCG, Beta Chain, Quant, S: <1 m[IU]/mL (ref <5)

## 2022-09-09 LAB — TROPONIN I (HIGH SENSITIVITY)
Troponin I (High Sensitivity): 2 ng/L (ref <18)
Troponin I (High Sensitivity): <2 ng/L (ref <18)

## 2022-09-09 LAB — ETHANOL: Alcohol, Ethyl (B): 55 mg/dL — ABNORMAL HIGH (ref <10)

## 2022-09-09 MED ORDER — SODIUM CHLORIDE 0.9 % IV BOLUS
1000.0000 mL | Freq: Once | INTRAVENOUS | Status: AC
  Administered 2022-09-09: 1000 mL via INTRAVENOUS

## 2022-09-09 NOTE — ED Triage Notes (Signed)
Pt arrives c/o CP. Admits to alcohol use tonight. Pt states that she feels depressed and was drinking tonight "to calm down". Drinks alcohol once weekly. Denies SI/HI.

## 2022-09-09 NOTE — ED Notes (Signed)
Attempted to take pt to the bathroom while receiving vitals but pt insisted that she did not need to go at this time.

## 2022-09-09 NOTE — Discharge Instructions (Addendum)
I given you the information for some offices to follow-up with for alcohol use disorder  Le di la informacin de algunos consultorios para Education officer, environmental un seguimiento del trastorno por consumo de alcohol.  I would also like you to follow-up with [facility]

## 2022-09-09 NOTE — ED Notes (Signed)
Patient unable to give urine sample at this time

## 2022-09-09 NOTE — ED Provider Notes (Signed)
[facility] EMERGENCY DEPARTMENT AT [facility] Provider Note   CSN: [id number] Arrival date & time: 09/09/22  0135     History  Chief Complaint  Patient presents with   Alcohol Intoxication    Joann Clay is a 47 y.o. female.  The history is provided by the patient. The history is limited by a language barrier. A language interpreter was used.  Alcohol Intoxication     Patient was dropped off by her acquaintance to the ER with complaints of chest pain.  Patient is a Spanish-speaking, history obtained using in which interpreter.  It is difficult to obtain history from patient as she does not want to give too much information.  Her primary complaint is chest discomfort.  Patient admits that she drinks alcohol specifically beer today and she is having some pain in her chest.  States she drinks alcohol because she feels depressed.  She denies active self-harm denies SI HI or hallucination.  She does not get too much information in regards to her chest discomfort but points to her mid chest.  No complaints of fever or chills no productive cough no shortness of breath denies any abdominal pain nausea vomiting diarrhea.  Patient denies any drug use.  History is limited.  Home Medications Prior to Admission medications   Not on File      Allergies    Patient has no known allergies.    Review of Systems   Review of Systems  Unable to perform ROS: Mental status change    Physical Exam Updated Vital Signs BP (!) 150/101   Pulse 96   Resp 18   Ht 5' (1.524 m)   Wt 77.1 kg   LMP 09/02/2022 (Approximate)   SpO2 99%   BMI 33.20 kg/m  Physical Exam Vitals and nursing note reviewed.  Constitutional:      General: She is not in acute distress.    Appearance: She is well-developed.     Comments: Patient appears intoxicated and does not want to engage in conversation much.  She is however without acute distress.  HENT:     Head: Normocephalic and atraumatic.   Eyes:     Extraocular Movements: Extraocular movements intact.     Conjunctiva/sclera: Conjunctivae normal.     Pupils: Pupils are equal, round, and reactive to light.  Cardiovascular:     Rate and Rhythm: Normal rate and regular rhythm.     Pulses: Normal pulses.     Heart sounds: Normal heart sounds.  Pulmonary:     Effort: Pulmonary effort is normal.  Chest:     Chest wall: No tenderness.  Abdominal:     Palpations: Abdomen is soft.     Tenderness: There is no abdominal tenderness.  Musculoskeletal:     Cervical back: Normal range of motion and neck supple.  Skin:    Findings: No rash.  Neurological:     Mental Status: She is alert.  Psychiatric:        Mood and Affect: Mood normal.     ED Results / Procedures / Treatments   Labs (all labs ordered are listed, but only abnormal results are displayed) Labs Reviewed  COMPREHENSIVE METABOLIC PANEL - Abnormal; Notable for the following components:      Result Value   CO2 18 (*)    Calcium 8.6 (*)    Total Protein 8.3 (*)    GFR, Estimated 52 (*)    All other components within normal limits  ETHANOL -  Abnormal; Notable for the following components:   Alcohol, Ethyl (B) 55 (*)    All other components within normal limits  CBC WITH DIFFERENTIAL/PLATELET  HCG, QUANTITATIVE, PREGNANCY  TROPONIN I (HIGH SENSITIVITY)  TROPONIN I (HIGH SENSITIVITY)    EKG None  Date: 09/09/2022  Rate: 82  Rhythm: normal sinus rhythm  QRS Axis: normal  Intervals: normal  ST/T Wave abnormalities: normal  Conduction Disutrbances: none  Narrative Interpretation:   Old EKG Reviewed: No significant changes noted    Radiology DG Chest 2 View  Result Date: 09/09/2022 CLINICAL DATA:  Chest pain. EXAM: CHEST - 2 VIEW COMPARISON:  None Available. FINDINGS: The heart size and mediastinal contours are within normal limits. Both lungs are clear. The visualized skeletal structures are unremarkable. IMPRESSION: No evidence of acute chest  disease. Electronically Signed   By: Almira Bar M.D.   On: 09/09/2022 05:18    Procedures Procedures    Medications Ordered in ED Medications  sodium chloride 0.9 % bolus 1,000 mL (0 mLs Intravenous Stopped 09/09/22 0531)    ED Course/ Medical Decision Making/ A&P Clinical Course as of 09/09/22 2200  Wed Sep 09, 2022  0641 CC of CP. Was depressed and drank heavily and was dropped off by friends.   [WF]    Clinical Course User Index [WF] Gailen Shelter, [place]                             Medical Decision Making Amount and/or Complexity of Data Reviewed Labs: ordered. Radiology: ordered. ECG/medicine tests: ordered.   BP (!) 150/101   Pulse 96   Resp 18   Ht 5' (1.524 m)   Wt 77.1 kg   LMP 09/02/2022 (Approximate)   SpO2 99%   BMI 33.20 kg/m   1:54 AM  Patient was dropped off by her acquaintance to the ER with complaints of chest pain.  Patient is a Spanish-speaking, history obtained using in which interpreter.  It is difficult to obtain history from patient as she does not want to give too much information.  Her primary complaint is chest discomfort.  Patient admits that she drinks alcohol specifically beer today and she is having some pain in her chest.  States she drinks alcohol because she feels depressed.  She denies active self-harm denies SI HI or hallucination.  She does not get too much information in regards to her chest discomfort but points to her mid chest.  No complaints of fever or chills no productive cough no shortness of breath denies any abdominal pain nausea vomiting diarrhea.  Patient denies any drug use.  History is limited.  On exam, patient is laying in bed appears intoxicated.  She however is not in any acute respiratory distress.  No reproducible tenderness noted on initial exam.  No signs of trauma noted.  Workup initiated.  -Labs ordered, independently viewed and interpreted by me.  Labs remarkable for UDS pending, the remainder of blood work are  overall reassuring.  Alcohol elevated at 55. -The patient was maintained on a cardiac monitor.  I personally viewed and interpreted the cardiac monitored which showed an underlying rhythm of: NSR -Imaging independently viewed and interpreted by me and I agree with radiologist's interpretation.  Result remarkable for CXR without acute changes -This patient presents to the ED for concern of AMS, this involves an extensive number of treatment options, and is a complaint that carries with it a high risk of  complications and morbidity.  The differential diagnosis includes alcohol intoxication, drug induced mood disorder, psychosis, depression, ACS, PNA, PTX -Co morbidities that complicate the patient evaluation includes none -Treatment includes IVF -Reevaluation of the patient after these medicines showed that the patient improved -PCP office notes or outside notes reviewed -Discussion with oncoming team who will monitor pt until pt is clinically sober -Escalation to admission/observation considered: pending dispo         Final Clinical Impression(s) / ED Diagnoses Final diagnoses:  Alcoholic intoxication without complication [facility])    Rx / DC Orders ED Discharge Orders     None         Fayrene Helper, PA-C 09/09/22 2201    Sabas Sous, MD 09/10/22 [phone]

## 2022-09-09 NOTE — ED Provider Notes (Signed)
Accepted handoff at shift change from [facility]. Please see prior provider note for more detail.   Briefly: Patient is 47 y.o.    Per prior PA note "Patient was dropped off by her acquaintance to the ER with complaints of chest pain.  Patient is a Spanish-speaking, history obtained using in which interpreter.  It is difficult to obtain history from patient as she does not want to give too much information.  Her primary complaint is chest discomfort.  Patient admits that she drinks alcohol specifically beer today and she is having some pain in her chest.  States she drinks alcohol because she feels depressed.  She denies active self-harm denies SI HI or hallucination.  She does not get too much information in regards to her chest discomfort but points to her mid chest.  No complaints of fever or chills no productive cough no shortness of breath denies any abdominal pain nausea vomiting diarrhea.  Patient denies any drug use.  History is limited."   My evaluation patient is not endorsing any chest pain.  She states she has some neck pain left side of her neck which is achy and sore.  Plan: Discharge      Physical Exam  BP 100/68 (BP Location: Left Arm)   Pulse 77   Temp 98.8 F (37.1 C) (Oral)   Resp 16   Ht 5' (1.524 m)   Wt 77.1 kg   LMP 09/02/2022 (Approximate)   SpO2 99%   BMI 33.20 kg/m   Physical Exam  Procedures  Procedures Results for orders placed or performed during the hospital encounter of 09/09/22  Comprehensive metabolic panel  Result Value Ref Range   Sodium 139 135 - 145 mmol/L   Potassium 3.8 3.5 - 5.1 mmol/L   Chloride 110 98 - 111 mmol/L   CO2 18 (L) 22 - 32 mmol/L   Glucose, Bld 83 70 - 99 mg/dL   BUN 6 6 - 20 mg/dL   Creatinine, Ser 6.96 0.44 - 1.00 mg/dL   Calcium 8.6 (L) 8.9 - 10.3 mg/dL   Total Protein 8.3 (H) 6.5 - 8.1 g/dL   Albumin 4.1 3.5 - 5.0 g/dL   AST 23 15 - 41 U/L   ALT 27 0 - 44 U/L   Alkaline Phosphatase 61 38 - 126 U/L   Total  Bilirubin 0.9 0.3 - 1.2 mg/dL   GFR, Estimated 52 (L) >60 mL/min   Anion gap 11 5 - 15  Ethanol  Result Value Ref Range   Alcohol, Ethyl (B) 55 (H) <10 mg/dL  CBC with Differential  Result Value Ref Range   WBC 9.8 4.0 - 10.5 K/uL   RBC 4.74 3.87 - 5.11 MIL/uL   Hemoglobin 13.8 12.0 - 15.0 g/dL   HCT 29.5 28.4 - 13.2 %   MCV 92.2 80.0 - 100.0 fL   MCH 29.1 26.0 - 34.0 pg   MCHC 31.6 30.0 - 36.0 g/dL   RDW 44.0 10.2 - 72.5 %   Platelets 303 150 - 400 K/uL   nRBC 0.0 0.0 - 0.2 %   Neutrophils Relative % 73 %   Neutro Abs 7.1 1.7 - 7.7 K/uL   Lymphocytes Relative 16 %   Lymphs Abs 1.6 0.7 - 4.0 K/uL   Monocytes Relative 9 %   Monocytes Absolute 0.9 0.1 - 1.0 K/uL   Eosinophils Relative 1 %   Eosinophils Absolute 0.1 0.0 - 0.5 K/uL   Basophils Relative 1 %   Basophils  Absolute 0.1 0.0 - 0.1 K/uL   Immature Granulocytes 0 %   Abs Immature Granulocytes 0.01 0.00 - 0.07 K/uL  hCG, quantitative, pregnancy  Result Value Ref Range   hCG, Beta Chain, Quant, S <1 <5 mIU/mL  Troponin I (High Sensitivity)  Result Value Ref Range   Troponin I (High Sensitivity) 2 <18 ng/L  Troponin I (High Sensitivity)  Result Value Ref Range   Troponin I (High Sensitivity) <2 <18 ng/L   DG Chest 2 View  Result Date: 09/09/2022 CLINICAL DATA:  Chest pain. EXAM: CHEST - 2 VIEW COMPARISON:  None Available. FINDINGS: The heart size and mediastinal contours are within normal limits. Both lungs are clear. The visualized skeletal structures are unremarkable. IMPRESSION: No evidence of acute chest disease. Electronically Signed   By: Almira Bar M.D.   On: 09/09/2022 05:18    ED Course / MDM   Clinical Course as of 09/09/22 0955  Wed Sep 09, 2022  0641 CC of CP. Was depressed and drank heavily and was dropped off by friends.   [WF]    Clinical Course User Index [WF] Gailen Shelter, [place]   Medical Decision Making Amount and/or Complexity of Data Reviewed Labs: ordered. Radiology:  ordered. ECG/medicine tests: ordered.   Patient has successfully metabolize to freedom.  Able to ambulate.  Will discharge home with resource document for outpatient therapy counseling and substance use.  Reassuring workup here normal chest x-ray troponins and EKG nonischemic.  Will recommend she follow-up with the [facility] health wellness clinic       Gailen Shelter, [place] 09/09/22 1610    Rexford Maus, DO 09/09/22 1524

## 2024-04-16 ENCOUNTER — Emergency Department (HOSPITAL_COMMUNITY)

## 2024-04-16 ENCOUNTER — Emergency Department (HOSPITAL_COMMUNITY)
Admission: EM | Admit: 2024-04-16 | Discharge: 2024-04-16 | Disposition: A | Discharge status: Home / Self Care | Attending: Emergency Medicine | Admitting: Emergency Medicine

## 2024-04-16 DIAGNOSIS — M25562 Pain in left knee: Secondary | ICD-10-CM | POA: Insufficient documentation

## 2024-04-16 MED ORDER — CYCLOBENZAPRINE HCL 10 MG PO TABS
10.0000 mg | ORAL_TABLET | Freq: Once | ORAL | Status: AC
  Administered 2024-04-16: 10 mg via ORAL
  Filled 2024-04-16: qty 1

## 2024-04-16 MED ORDER — CYCLOBENZAPRINE HCL 10 MG PO TABS: 10.0000 mg | ORAL_TABLET | Freq: Two times a day (BID) | ORAL | 0 refills | Status: AC | PRN

## 2024-04-16 MED ORDER — IBUPROFEN 800 MG PO TABS
800.0000 mg | ORAL_TABLET | Freq: Once | ORAL | Status: AC
  Administered 2024-04-16: 800 mg via ORAL
  Filled 2024-04-16: qty 1

## 2024-04-16 MED ORDER — IBUPROFEN 600 MG PO TABS: 600.0000 mg | ORAL_TABLET | Freq: Four times a day (QID) | ORAL | 0 refills | Status: AC | PRN

## 2024-04-16 NOTE — Discharge Instructions (Addendum)
 Your pain is likely muscle cramp.  Take medication as prescribed.  You may wear a knee brace for stability and support as needed.  Return if you develop leg swelling, calf tenderness, fever or shortness of breath.

## 2024-04-16 NOTE — ED Triage Notes (Signed)
 Pt arrived via EMS From home left knee sharp tingling.  156/100 96 96

## 2024-04-16 NOTE — ED Provider Notes (Signed)
 " [place] EMERGENCY DEPARTMENT AT [facility] Provider Note   CSN: [id number] Arrival date & time: 04/16/24  1927     Patient presents with: Knee Pain   Joann Clay is a 49 y.o. female.   The history is provided by the patient, the EMS personnel and medical records. The history is limited by a language barrier. A language interpreter was used.  Knee Pain    49 year old Spanish-speaking female brought here via EMS from home for evaluation of knee pain.  Patient states she was at church this morning and initially she felt fine however after some standing she started noticing pain to her left knee which feels like cramping.  Pain seems to radiate towards her left thigh and now becoming more crampy and uncomfortable.  She does not endorse any fever or chills no chest pain or shortness of breath no productive cough or hemoptysis no numbness or weakness of her leg and no back pain.  She denies any trauma except she did mention a door struck her leg a week ago but that has since improved.  She does not have any history of PE or DVT no recent surgery prolonged bedrest active cancer or hemoptysis.  Prior to Admission medications  Medication Sig Start Date End Date Taking? Authorizing Provider  benzonatate  (TESSALON ) 100 MG capsule Take 1 capsule (100 mg total) by mouth every 8 (eight) hours. Patient not taking: Reported on 01/24/2019 12/13/18   Law, Alexandra M, PA-C  levonorgestrel -ethinyl estradiol  (PORTIA-28) 0.15-30 MG-MCG tablet Take 1 tablet by mouth daily. Patient not taking: Reported on 01/24/2019 03/21/18   Corene Coy, MD  norgestrel -ethinyl estradiol  (LO/OVRAL ) 0.3-30 MG-MCG tablet Take 1 tablet by mouth daily. Patient not taking: Reported on 06/13/2019 02/10/19   Dove, Myra C, MD    Allergies: Patient has no known allergies.    Review of Systems  All other systems reviewed and are negative.   Updated Vital Signs BP 113/81 (BP Location: Right Arm)    Pulse 82   Temp 98.3 F (36.8 C) (Oral)   Resp 18   SpO2 100%   Physical Exam Vitals and nursing note reviewed.  Constitutional:      General: She is not in acute distress.    Appearance: She is well-developed. She is obese.  HENT:     Head: Atraumatic.  Eyes:     Conjunctiva/sclera: Conjunctivae normal.  Pulmonary:     Effort: Pulmonary effort is normal.  Musculoskeletal:        General: Tenderness (Left knee: Exquisite tenderness about the knee on palpation without any edema erythema or warmth noted.  Patella is located.  Able to flex and extend the knee.  Leg compartment is soft, no tenderness to the calf, no edema noted.  Intact pedal pulse) present.     Cervical back: Neck supple.  Skin:    Findings: No rash.  Neurological:     Mental Status: She is alert.  Psychiatric:        Mood and Affect: Mood normal.     (all labs ordered are listed, but only abnormal results are displayed) Labs Reviewed - No data to display  EKG: None  Radiology: DG Knee 2 Views Left Result Date: 04/16/2024 CLINICAL DATA:  Anterior knee pain EXAM: LEFT KNEE - 1-2 VIEW COMPARISON:  None Available. FINDINGS: No evidence of fracture, dislocation, or joint effusion. No evidence of arthropathy or other focal bone abnormality. Soft tissues are unremarkable. IMPRESSION: Negative. Electronically Signed   By: Luke  Scott M.D.   On: 04/16/2024 19:56     Procedures   Medications Ordered in the ED  ibuprofen  (ADVIL ) tablet 800 mg (has no administration in time range)  cyclobenzaprine  (FLEXERIL ) tablet 10 mg (has no administration in time range)                                    Medical Decision Making Risk Prescription drug management.   BP 113/81 (BP Location: Right Arm)   Pulse 82   Temp 98.3 F (36.8 C) (Oral)   Resp 18   SpO2 100%   40:2 PM  49 year old Spanish-speaking female brought here via EMS from home for evaluation of knee pain.  Patient states she was at church this  morning and initially she felt fine however after some standing she started noticing pain to her left knee which feels like cramping.  Pain seems to radiate towards her left thigh and now becoming more crampy and uncomfortable.  She does not endorse any fever or chills no chest pain or shortness of breath no productive cough or hemoptysis no numbness or weakness of her leg and no back pain.  She denies any trauma except she did mention a door struck her leg a week ago but that has since improved.  She does not have any history of PE or DVT no recent surgery prolonged bedrest active cancer or hemoptysis.  On exam, patient has tenderness to palpation about the left knee with any kind of manipulation.  However her knee overall normal.  Without any signs of cellulitis or any signs of deformity.  Her left thigh and left calf are nontender and her leg compartment is soft and she is neurovascularly intact.  X-ray of the left knee independently viewed interpreted by me, fortunately did not show any concerning finding.  Agree with radiology interpretation.  Suspect leg cramp.  Doubt infection doubt DVT doubt septic joint or any other acute emergent medical condition.  Will provide supportive care.  After receiving treatment patient felt much better and felt comfortable going home.  Will provide supportive care including ibuprofen  and Flexeril .  Orthopedic referral given as needed.  Return precaution given.     Final diagnoses:  Acute pain of left knee    ED Discharge Orders          Ordered    ibuprofen  (ADVIL ) 600 MG tablet  Every 6 hours PRN        04/16/24 2103    cyclobenzaprine  (FLEXERIL ) 10 MG tablet  2 times daily PRN        04/16/24 2103               Nivia Colon, PA-C 04/16/24 2104    Garrick Charleston, MD 04/16/24 2257  "
